# Patient Record
Sex: Male | Born: 1950 | Race: Black or African American | Hispanic: No | Smoking: Former smoker
Health system: Southern US, Community
[De-identification: ages and names within clinical notes are randomized; demographics above are authoritative.]

## PROBLEM LIST (undated history)

## (undated) DIAGNOSIS — M549 Dorsalgia, unspecified: Secondary | ICD-10-CM

## (undated) DIAGNOSIS — J42 Unspecified chronic bronchitis: Secondary | ICD-10-CM

## (undated) DIAGNOSIS — E46 Unspecified protein-calorie malnutrition: Secondary | ICD-10-CM

## (undated) DIAGNOSIS — C229 Malignant neoplasm of liver, not specified as primary or secondary: Secondary | ICD-10-CM

## (undated) DIAGNOSIS — B1910 Unspecified viral hepatitis B without hepatic coma: Secondary | ICD-10-CM

## (undated) DIAGNOSIS — B159 Hepatitis A without hepatic coma: Secondary | ICD-10-CM

## (undated) DIAGNOSIS — E079 Disorder of thyroid, unspecified: Secondary | ICD-10-CM

## (undated) DIAGNOSIS — F32A Depression, unspecified: Secondary | ICD-10-CM

## (undated) DIAGNOSIS — K7460 Unspecified cirrhosis of liver: Secondary | ICD-10-CM

## (undated) DIAGNOSIS — F329 Major depressive disorder, single episode, unspecified: Secondary | ICD-10-CM

## (undated) DIAGNOSIS — R6510 Systemic inflammatory response syndrome (SIRS) of non-infectious origin without acute organ dysfunction: Secondary | ICD-10-CM

## (undated) DIAGNOSIS — S2242XA Multiple fractures of ribs, left side, initial encounter for closed fracture: Secondary | ICD-10-CM

## (undated) DIAGNOSIS — I639 Cerebral infarction, unspecified: Secondary | ICD-10-CM

## (undated) DIAGNOSIS — R51 Headache: Secondary | ICD-10-CM

## (undated) DIAGNOSIS — R200 Anesthesia of skin: Secondary | ICD-10-CM

## (undated) DIAGNOSIS — B1920 Unspecified viral hepatitis C without hepatic coma: Secondary | ICD-10-CM

## (undated) DIAGNOSIS — G40909 Epilepsy, unspecified, not intractable, without status epilepticus: Secondary | ICD-10-CM

## (undated) DIAGNOSIS — G8929 Other chronic pain: Secondary | ICD-10-CM

## (undated) DIAGNOSIS — R55 Syncope and collapse: Principal | ICD-10-CM

## (undated) DIAGNOSIS — I252 Old myocardial infarction: Secondary | ICD-10-CM

## (undated) HISTORY — PX: CIRCUMCISION: SUR203

---

## 2000-01-14 ENCOUNTER — Encounter: Payer: Self-pay | Admitting: Emergency Medicine

## 2000-01-14 ENCOUNTER — Inpatient Hospital Stay (HOSPITAL_COMMUNITY): Admission: EM | Admit: 2000-01-14 | Discharge: 2000-01-22 | Payer: Self-pay | Admitting: Emergency Medicine

## 2000-01-29 ENCOUNTER — Inpatient Hospital Stay (HOSPITAL_COMMUNITY): Admission: EM | Admit: 2000-01-29 | Discharge: 2000-02-09 | Payer: Self-pay | Admitting: Emergency Medicine

## 2000-01-29 ENCOUNTER — Encounter: Payer: Self-pay | Admitting: Emergency Medicine

## 2000-04-19 ENCOUNTER — Encounter: Admission: RE | Admit: 2000-04-19 | Discharge: 2000-04-19 | Payer: Self-pay | Admitting: Internal Medicine

## 2000-06-17 ENCOUNTER — Encounter: Admission: RE | Admit: 2000-06-17 | Discharge: 2000-06-17 | Payer: Self-pay | Admitting: Hematology and Oncology

## 2006-12-09 ENCOUNTER — Emergency Department (HOSPITAL_COMMUNITY): Admission: EM | Admit: 2006-12-09 | Discharge: 2006-12-09 | Payer: Self-pay | Admitting: Emergency Medicine

## 2007-01-02 ENCOUNTER — Emergency Department (HOSPITAL_COMMUNITY): Admission: EM | Admit: 2007-01-02 | Discharge: 2007-01-02 | Payer: Self-pay | Admitting: Emergency Medicine

## 2009-09-14 DIAGNOSIS — I252 Old myocardial infarction: Secondary | ICD-10-CM

## 2009-09-14 DIAGNOSIS — I639 Cerebral infarction, unspecified: Secondary | ICD-10-CM

## 2009-09-14 HISTORY — PX: LIVER SURGERY: SHX698

## 2009-09-14 HISTORY — DX: Old myocardial infarction: I25.2

## 2009-09-14 HISTORY — DX: Cerebral infarction, unspecified: I63.9

## 2009-10-03 ENCOUNTER — Emergency Department (HOSPITAL_COMMUNITY): Admission: EM | Admit: 2009-10-03 | Discharge: 2009-10-03 | Payer: Self-pay | Admitting: Emergency Medicine

## 2009-10-04 ENCOUNTER — Emergency Department (HOSPITAL_COMMUNITY): Admission: EM | Admit: 2009-10-04 | Discharge: 2009-10-05 | Payer: Self-pay | Admitting: Emergency Medicine

## 2010-11-30 LAB — RAPID URINE DRUG SCREEN, HOSP PERFORMED
Amphetamines: NOT DETECTED
Barbiturates: NOT DETECTED
Benzodiazepines: NOT DETECTED
Cocaine: NOT DETECTED
Opiates: NOT DETECTED
Tetrahydrocannabinol: NOT DETECTED

## 2010-11-30 LAB — HEPATIC FUNCTION PANEL
ALT: 65 U/L — ABNORMAL HIGH (ref 0–53)
AST: 79 U/L — ABNORMAL HIGH (ref 0–37)
Alkaline Phosphatase: 94 U/L (ref 39–117)
Bilirubin, Direct: 0.3 mg/dL (ref 0.0–0.3)

## 2010-11-30 LAB — DIFFERENTIAL
Basophils Absolute: 0 10*3/uL (ref 0.0–0.1)
Lymphocytes Relative: 29 % (ref 12–46)
Lymphs Abs: 1.7 10*3/uL (ref 0.7–4.0)
Monocytes Absolute: 0.8 10*3/uL (ref 0.1–1.0)
Neutro Abs: 2.8 10*3/uL (ref 1.7–7.7)

## 2010-11-30 LAB — CBC
Hemoglobin: 12.6 g/dL — ABNORMAL LOW (ref 13.0–17.0)
RBC: 3.76 MIL/uL — ABNORMAL LOW (ref 4.22–5.81)
RDW: 14.2 % (ref 11.5–15.5)
WBC: 6.1 10*3/uL (ref 4.0–10.5)

## 2010-11-30 LAB — BASIC METABOLIC PANEL
Calcium: 8.8 mg/dL (ref 8.4–10.5)
GFR calc Af Amer: >60 mL/min (ref >60)
GFR calc non Af Amer: >60 mL/min (ref >60)
Glucose, Bld: 96 mg/dL (ref 70–99)
Potassium: 4 mEq/L (ref 3.5–5.1)
Sodium: 134 mEq/L — ABNORMAL LOW (ref 135–145)

## 2010-11-30 LAB — ETHANOL: Alcohol, Ethyl (B): 118 mg/dL — ABNORMAL HIGH (ref 0–10)

## 2010-11-30 LAB — TRICYCLICS SCREEN, URINE: TCA Scrn: NOT DETECTED

## 2010-12-01 LAB — DIFFERENTIAL
Basophils Relative: 0 % (ref 0–1)
Eosinophils Absolute: 0.8 10*3/uL — ABNORMAL HIGH (ref 0.0–0.7)
Monocytes Absolute: 0.8 10*3/uL (ref 0.1–1.0)
Monocytes Relative: 14 % — ABNORMAL HIGH (ref 3–12)

## 2010-12-01 LAB — RAPID URINE DRUG SCREEN, HOSP PERFORMED
Benzodiazepines: NOT DETECTED
Cocaine: NOT DETECTED
Tetrahydrocannabinol: NOT DETECTED

## 2010-12-01 LAB — CBC
Hemoglobin: 12.3 g/dL — ABNORMAL LOW (ref 13.0–17.0)
Platelets: 104 10*3/uL — ABNORMAL LOW (ref 150–400)
RDW: 14.3 % (ref 11.5–15.5)

## 2010-12-01 LAB — ETHANOL: Alcohol, Ethyl (B): <5 mg/dL (ref 0–10)

## 2010-12-01 LAB — COMPREHENSIVE METABOLIC PANEL
ALT: 60 U/L — ABNORMAL HIGH (ref 0–53)
AST: 78 U/L — ABNORMAL HIGH (ref 0–37)
Albumin: 2.8 g/dL — ABNORMAL LOW (ref 3.5–5.2)
Alkaline Phosphatase: 92 U/L (ref 39–117)
Potassium: 4.1 mEq/L (ref 3.5–5.1)
Sodium: 137 mEq/L (ref 135–145)
Total Protein: 7.6 g/dL (ref 6.0–8.3)

## 2011-01-30 NOTE — Discharge Summary (Signed)
[facility]  Patient:    Cameron Santos, Cameron Santos                           MRN: [id number] Adm. Date:  [id number] Disc. Date: 02/09/00 Attending:  Levy Sjogren Dictator:   Tawni Millers, M.D.                           Discharge Summary  DISCHARGE DIAGNOSES: 1. End-stage liver disease, secondary to alcohol, hepatitis-A, B, and C. 2. Mild pancreatitis. 3. Alcohol abuse. 4. Tobacco abuse.  DISCHARGE MEDICATIONS: 1. Ciprofloxacin 750 mg one p.o. q. week on Thursday. 2. Peridex oral mouth wash 15 cc p.o. b.i.d. swish and spit. 3. Vitamin K 10 mg one p.o. q.d. 4. Propranolol 40 mg one p.o. b.i.d. 5. K-Dur 20 mEq one p.o. q.d. 6. Lactulose syrup 30 cc b.i.d. 7. Lasix 40 mg one p.o. b.i.d. 8. Aldactone 50 mg p.o. b.i.d. 9. Multivitamins one p.o. q.d.  HISTORY OF PRESENT ILLNESS:  This is a 60 year old African-American male with end-stage liver disease with hepatitis-B, C, and A, and a long history of alcohol abuse, who was just discharged on Jan 21, 2000, with an exacerbation of liver failure.  He was discharged home with Hospice services, to stay with his estranged wife.  Due to a social situation, he was no longer able to stay there, and he was dropped off at the emergency room.  PHYSICAL EXAMINATION:  VITAL SIGNS:  Temperature 98.6 degrees, blood pressure 125/56, pulse 106, respirations 24.  GENERAL:  He is a pleasant black male, in no acute distress.  HEENT:  Pertinent for scleral icterus, and poor dentition.  LUNGS:  Clear to auscultation bilaterally.  HEART:  A regular rate and rhythm.  ABDOMEN:  Only slightly distended, soft, only mildly tender with positive bowel  sounds.  EXTREMITIES:  No cyanosis, clubbing, or edema.  NEUROLOGIC:  Shows nonfocal.  Positive asterixis.  A chest x-ray:  No acute disease.  LABORATORY DATA:  Sodium 136, potassium 4.6, chloride 10, CO2 of 25, BUN 16, creatinine 1.2, blood sugar 109.  Total protein  11.1, albumin 2.1, AST 97, ALT 0, alkaline phosphatase 110, total bilirubin 5.6.  (Bilirubin was less than 2 upon  discharge from the hospital last week.)  Also has a lipase of 162, amylase of 606. A urine drug screen is cocaine-positive.  Urinalysis otherwise shows urobilinogen. Cardiac enzymes negative.  PT 21.8, INR 2.7, PTT 39.  HOSPITAL COURSE: #1 - END-STAGE LIVER DISEASE:  Actually due to the increased icterus, the patient must have been drinking outside of the hospital.  He also denies cocaine use, but his urine drug screen is positive.  His liver will continue to worsen if he does not stop drinking, and it is unknown how much longer his liver will hold out, even without drinking.  The patient is certainly end-stage, and will be transferred [facility] for Hospice care.  #2 - MILD PANCREATITIS:  Again this is likely brought on by the patients use of  alcohol.  He was kept n.p.o. for a day or so, and he began to tolerate p.o., and the lipase came down.  Upon discharge he was tolerating a full diet with no abdominal symptoms.  #3 - SOCIAL SITUATION:  The patient certainly needs Hospice care and will benefit from the services provided by [facility].  DISCHARGE PLANNING:  The patient will  be sent to [facility] on Feb 09, 2000, or Hospice care.  At the time of discharge the patient still expresses a desire for a full code blue.  This should be readdressed periodically, as his condition changes. Also, as the patient is on diuretics and has such severe liver failure, his electrolytes should be checked at least weekly, and medicines adjusted as indicated.  FOLLOWUP:  He will be followed up in the outpatient clinic on February 17, 2000.  At  this time the patient should have a BMET and a CBC. DD:  02/08/00 TD:  02/08/00 Job: [id number]

## 2011-01-30 NOTE — Discharge Summary (Signed)
[place]  Patient:    Cameron Santos, Cameron Santos                           MRN: [id number] Adm. Date:  [id number] Disc. Date: [id number] Attending:  Levy Sjogren Dictator:   Tawni Millers                           Discharge Summary  DATE OF BIRTH: 1950-11-03 (may be [birth year]  DISCHARGE DIAGNOSES:  1. End-stage liver disease secondary to alcohol, hepatitis A, hepatitis B,     hepatitis C.     a. Coagulopathy with elevated PT/INR and PTT.     b. Ascites.     c. Mild hepatic encephalopathy with NH3 (ammonia) from 60 to 53 this        admission.  2. Anemia/thrombocytopenia, multifactorial.  3. History of tobacco abuse.  4. History of alcohol abuse.  5. Mild acute renal failure, resolved.  HISTORY OF PRESENT ILLNESS: This patient is a 60 year old black male with a history of heavy alcohol use in the past, presenting with several days of increased abdominal girth, abdominal pain, and turning yellow.  He denies any hematemesis or hemoptysis.  Black and bloody stool noted.  He also denies recent alcohol use and said he had been clean for 18 months (though his wife later stated that he had in fact been drinking more recently, and the patient admitted that he only drinks a beer or two now and then).  SOCIAL HISTORY: The patient lives with his uncle and takes care of himself. He has a history of very heavy alcohol use.  FAMILY HISTORY: He has not talked to family members in many years.  MEDICATIONS: None.  PHYSICAL EXAMINATION:  VITAL SIGNS: Temperature 96.6 degrees, pulse 95, respirations 16, blood pressure 126/90.  Saturations 98% on room air.  GENERAL: This is a thin black male with a mildly slurred speech, but he is alert and oriented x 3.  HEENT: He has marked icterus.  He has sluggish pupils.  Oropharynx is dry with feted odor to breath.  Poor dentition.  NECK: Supple.  LUNGS: Few crackles noted in the bases, otherwise good air  movement.  CARDIOVASCULAR: Regular rate and rhythm.  ABDOMEN: Markedly distended and protuberant and tense, with trace of visible veins.  He has positive bowel sounds and there is mild tenderness, especially in the upper quadrants.  There is no rebound and no guarding.  EXTREMITIES:  He has trace edema of his ankles.  He has 2+ pitting edema at his hips.  He also has sacral edema.  GU: He has penile swelling and edema, but no discharge.  RECTAL: Guaiac negative.  NEUROLOGIC: He is alert and oriented x 4.  Cranial nerves 2-12 intact.  He has positive asterixis.  LABORATORY DATA: Admission urinalysis showed rare bacteria, appearance both amber and cloudy, with moderate amount bilirubin.  BMP showed a sodium of 131, potassium 3.3, chloride 101, CO2 21, BUN 19, creatinine 1.7, glucose 79. Total bilirubin was 6.0, alkaline phosphatase 66, AST 100, ALT 47, total protein 9.0, albumin 1.5, calcium 8.5.  Lipase 45, amylase 269.  WBC 6.1, hemoglobin 9.3, platelets 57,000, MCV 105.3; neutrophils 3.9%, lymphocyte count 1.0%.  CT of the abdomen and pelvis shows positive ascites and cirrhosis, no visible masses, but cannot rule out carcinomatosis of the omentum.  Note the study was  without contrast.  HOSPITAL COURSE: #1 - END-STAGE LIVER DISEASE: During the hospitalization the patient was initiated on Aldactone and Lasix and propranolol.  He diuresed several kilograms of fluid this admission.  He initially was to have paracentesis, but he had a refractory coagulopathy with an INR over 3 despite repeated infusions with fresh frozen plasma and p.o. vitamin K.  Also this admission he was found to be hepatitis B surface antigen positive, hepatitis B surface antibody negative, and hepatitis B cor antibody IgM negative.  He was also found to be hepatitis A antibody positive and hepatitis C antibody positive.  Given the hepatitis, HIV was tested and was negative.  By the day of discharge  the patient had a PT of 22.8 and INR 2.9, with ammonia level of 54.  His hepatitis B dump agent was pending, and his bilirubin the day prior to discharge had gone down to 2.6.  #2 - MILD RENAL FAILURE: This was likely from the patients decreased p.o. intake.  By the day of discharge his BUN was 6 and creatinine 0.7.  #3 - DISPOSITION: As results trickled in on this patient and we discovered he is hepatitis A, B, and C positive, but hepatitis B surface antibody positive, the patient likely at least has active hepatitis B and may also have active hepatitis C.  In addition, with his heavy alcohol history his prognosis is very poor.  Code status was discussed and when the patients wife came on the scene the patient changed his mind about his code status.  However, the patient is Hospice appropriate given his poor prognosis, and they will accept him with a full code status.  Therefore the patient was discharged to an apartment that was arranged for him to stay in, and will follow up with Hospice.  He will also follow up with me in the outpatient clinic on Jan 29, 2000 at 2 p.m.  DISCHARGE CONDITION: By the time of discharge the patient was ambulating fully independently, and was feeling much better.  DISCHARGE MEDICATIONS:  1. Cipro 750 mg 1 p.o. every Thursday.  2. Spironolactone 50 mg 1 p.o. b.i.d.  3. Lasix 40 mg 1 p.o. b.i.d.  4. Multivitamin 1 q.d.  5. Lactulose syrup 30 cc p.o. b.i.d.  6. Potassium 20 mEq 1 p.o. q.d.  7. Propranolol 40 mg 1 p.o. b.i.d.  8. Vitamin K 10 mg 1 p.o. q.d.  SPECIAL INSTRUCTIONS: The patient was instructed to avoid all alcoholic beverages and will see me in the clinic. DD:  01/21/00 TD:  01/22/00 Job: [id number]

## 2012-09-19 ENCOUNTER — Emergency Department (HOSPITAL_COMMUNITY): Payer: Self-pay

## 2012-09-19 ENCOUNTER — Emergency Department (HOSPITAL_COMMUNITY)
Admission: EM | Admit: 2012-09-19 | Discharge: 2012-09-19 | Disposition: A | Discharge status: Home / Self Care | Payer: Self-pay | Attending: Emergency Medicine | Admitting: Emergency Medicine

## 2012-09-19 ENCOUNTER — Encounter (HOSPITAL_COMMUNITY): Payer: Self-pay | Admitting: Emergency Medicine

## 2012-09-19 DIAGNOSIS — R05 Cough: Secondary | ICD-10-CM | POA: Insufficient documentation

## 2012-09-19 DIAGNOSIS — T148XXA Other injury of unspecified body region, initial encounter: Secondary | ICD-10-CM

## 2012-09-19 DIAGNOSIS — Z8673 Personal history of transient ischemic attack (TIA), and cerebral infarction without residual deficits: Secondary | ICD-10-CM | POA: Insufficient documentation

## 2012-09-19 DIAGNOSIS — S79929A Unspecified injury of unspecified thigh, initial encounter: Secondary | ICD-10-CM | POA: Insufficient documentation

## 2012-09-19 DIAGNOSIS — R51 Headache: Secondary | ICD-10-CM | POA: Insufficient documentation

## 2012-09-19 DIAGNOSIS — Y929 Unspecified place or not applicable: Secondary | ICD-10-CM | POA: Insufficient documentation

## 2012-09-19 DIAGNOSIS — T07XXXA Unspecified multiple injuries, initial encounter: Secondary | ICD-10-CM | POA: Insufficient documentation

## 2012-09-19 DIAGNOSIS — W19XXXA Unspecified fall, initial encounter: Secondary | ICD-10-CM

## 2012-09-19 DIAGNOSIS — Z8619 Personal history of other infectious and parasitic diseases: Secondary | ICD-10-CM | POA: Insufficient documentation

## 2012-09-19 DIAGNOSIS — R059 Cough, unspecified: Secondary | ICD-10-CM | POA: Insufficient documentation

## 2012-09-19 DIAGNOSIS — I252 Old myocardial infarction: Secondary | ICD-10-CM | POA: Insufficient documentation

## 2012-09-19 DIAGNOSIS — W1809XA Striking against other object with subsequent fall, initial encounter: Secondary | ICD-10-CM | POA: Insufficient documentation

## 2012-09-19 DIAGNOSIS — Y939 Activity, unspecified: Secondary | ICD-10-CM | POA: Insufficient documentation

## 2012-09-19 DIAGNOSIS — R109 Unspecified abdominal pain: Secondary | ICD-10-CM | POA: Insufficient documentation

## 2012-09-19 DIAGNOSIS — S79919A Unspecified injury of unspecified hip, initial encounter: Secondary | ICD-10-CM | POA: Insufficient documentation

## 2012-09-19 LAB — BASIC METABOLIC PANEL
CO2: 24 mEq/L (ref 19–32)
Calcium: 9.5 mg/dL (ref 8.4–10.5)
Creatinine, Ser: 0.95 mg/dL (ref 0.50–1.35)
Glucose, Bld: 95 mg/dL (ref 70–99)
Sodium: 133 mEq/L — ABNORMAL LOW (ref 135–145)

## 2012-09-19 MED ORDER — IOHEXOL 300 MG/ML  SOLN
100.0000 mL | Freq: Once | INTRAMUSCULAR | Status: AC | PRN
  Administered 2012-09-19: 100 mL via INTRAVENOUS

## 2012-09-19 MED ORDER — HYDROCODONE-ACETAMINOPHEN 5-325 MG PO TABS: 1.0000 | ORAL_TABLET | ORAL | Status: DC | PRN

## 2012-09-19 MED ORDER — SODIUM CHLORIDE 0.9 % IV BOLUS (SEPSIS)
1000.0000 mL | Freq: Once | INTRAVENOUS | Status: AC
  Administered 2012-09-19: 1000 mL via INTRAVENOUS

## 2012-09-19 MED ORDER — IBUPROFEN 400 MG PO TABS: 400.0000 mg | ORAL_TABLET | Freq: Four times a day (QID) | ORAL | Status: DC | PRN

## 2012-09-19 MED ORDER — MORPHINE SULFATE 4 MG/ML IJ SOLN
4.0000 mg | Freq: Once | INTRAMUSCULAR | Status: AC
  Administered 2012-09-19: 4 mg via INTRAVENOUS
  Filled 2012-09-19: qty 1

## 2012-09-19 NOTE — ED Notes (Signed)
Back from CT

## 2012-09-19 NOTE — ED Provider Notes (Addendum)
History   This chart was scribed for Derwood Kaplan, MD by Gerlean Ren, ED Scribe. This patient was seen in room TR10C/TR10C and the patient's care was started at 4:37 PM    CSN: [id number]  Arrival date & time 09/19/12  1347   First MD Initiated Contact with Patient 09/19/12 1634      Chief Complaint  Patient presents with  . Fall     The history is provided by the patient. No language interpreter was used.   Cameron Santos is a 62 y.o. male with h/o hepatitis A, hepatitis B, hepatitis C, CVA, MI, who presents to the Emergency Department complaining of right lateral ribs, right hip, and right thigh pain after falling last night and hitting a dresser.  Rib pain worsened by coughing and deep breathing.  Pt is ambulatory with pain.  Pt denies head trauma and LOC.  Pt states he was drinking yesterday but denies being intoxicated at the time of the fall.  Pt denies tobacco use.  History reviewed. No pertinent past medical history.  History reviewed. No pertinent past surgical history.  History reviewed. No pertinent family history.  History  Substance Use Topics  . Smoking status: Never Smoker   . Smokeless tobacco: Not on file  . Alcohol Use: Yes     Comment: occ      Review of Systems  HENT: Positive for neck pain.   Respiratory: Positive for cough.   Cardiovascular: Negative for chest pain.  Gastrointestinal: Positive for abdominal pain.  Musculoskeletal: Negative for back pain.       Rib pain, hip pain  Neurological: Positive for headaches. Negative for weakness and numbness.  Hematological: Does not bruise/bleed easily.    Allergies  Penicillins  Home Medications  No current outpatient prescriptions on file.  BP 127/73  Pulse 102  Temp 98.1 F (36.7 C) (Oral)  Resp 16  SpO2 97%  Physical Exam  Nursing note and vitals reviewed. Constitutional: He is oriented to person, place, and time. He appears well-developed and well-nourished.  HENT:  Head: Normocephalic  and atraumatic.       No scalp hematoma, no abrasions, no bruises.  Eyes: Conjunctivae normal are normal.  Neck: Normal range of motion. No tracheal deviation present.  Cardiovascular: Normal rate, regular rhythm and normal heart sounds.   No murmur heard. Pulmonary/Chest: Effort normal and breath sounds normal. No respiratory distress. He has no wheezes.  Abdominal: Soft. Bowel sounds are normal. There is tenderness.       Soft on left side and non-tender, tenderness over RUQ to palpation  Musculoskeletal: He exhibits tenderness. He exhibits no edema.       Clavicle exam normal, bilateral shoulders non-tender, bilateral upper extremities non-tender to palpation, right posterior lateral side tender to palpation, right flank tenderness, pelvis stable, mild right hip tenderness to palpation, rest of bilateral lower extremity exam non-tender to palpation, paraspinal tenderness bilaterally in neck, no bony cervical spine tendneress.  Neurological: He is alert and oriented to person, place, and time.  Skin: Skin is warm and dry.  Psychiatric: He has a normal mood and affect. His behavior is normal.    ED Course  Procedures (including critical care time) DIAGNOSTIC STUDIES: Oxygen Saturation is 97% on room air, adequate by my interpretation.    COORDINATION OF CARE: 4:44 PM- Patient informed of clinical course, understands medical decision-making process, and agrees with plan.  Ordered right ribs unilateral w/ chest CT.  Dg Ribs Unilateral W/chest Right  09/19/2012  *RADIOLOGY REPORT*  Clinical Data: History of fall complaining of right lateral and anterior rib pain.  RIGHT RIBS AND CHEST - 3+ VIEW  Comparison: No priors.  Findings: Lung volumes are slightly low and there is a linear opacity at the right base, compatible with subsegmental atelectasis.  No acute consolidative air space disease.  No pleural effusions.  No pneumothorax.  No suspicious appearing pulmonary nodules or masses are  identified.  Heart size is normal. Mediastinal contours are unremarkable.  No evidence of pulmonary edema.  Dedicated views of the right ribs demonstrate no definite acute displaced right-sided rib fractures.  IMPRESSION: 1.  Low lung volumes with minimal right lower lobe subsegmental atelectasis. 2.  No acute displaced right-sided rib fractures.   Original Report Authenticated By: Trudie Reed, M.D.      No diagnosis found.    MDM  Medical screening examination/treatment/procedure(s) were performed by me as the supervising physician. Scribe service was utilized for documentation only.  Pt comes in with cc of fall. Pt has right sided tenderness - flank, ribs and abd. Also has some hip pain with ambulation - but able to ambulate. The abd tenderness is quite significant, so - we will get CT to r/o solid organ injury.   Derwood Kaplan, MD 09/19/12 1725  Pt's CT is negative for any solid organ injury, or bleeding. There is concerns for CA. Pt has had a cancer workup according to him at the [place]. We will print the results for him to take to the [place].  The patient was counseled on the dangers of alcohol and tobacco use, and was advised to quit.  Reviewed strategies to maximize success, including removing cigarettes and smoking materials from environment.   Derwood Kaplan, MD 09/19/12 [phone]

## 2012-09-19 NOTE — ED Notes (Signed)
The edp has seen the pt before myself.  Pt alert

## 2012-09-19 NOTE — ED Notes (Signed)
Iv started and  Pain med given. Pt waiting for a c-t abd

## 2012-09-19 NOTE — ED Notes (Signed)
EDP at bedside  

## 2012-09-19 NOTE — ED Notes (Signed)
Pt c/o right sided rib pain after falling and hitting ribs on dresser

## 2013-07-07 ENCOUNTER — Observation Stay (HOSPITAL_COMMUNITY)
Admission: EM | Admit: 2013-07-07 | Discharge: 2013-07-09 | DRG: 312 | Disposition: A | Discharge status: Home / Self Care | Payer: Non-veteran care | Attending: Family Medicine | Admitting: Family Medicine

## 2013-07-07 ENCOUNTER — Emergency Department (HOSPITAL_COMMUNITY): Payer: Self-pay

## 2013-07-07 ENCOUNTER — Encounter (HOSPITAL_COMMUNITY): Payer: Self-pay | Admitting: Emergency Medicine

## 2013-07-07 DIAGNOSIS — S2239XA Fracture of one rib, unspecified side, initial encounter for closed fracture: Secondary | ICD-10-CM

## 2013-07-07 DIAGNOSIS — D696 Thrombocytopenia, unspecified: Secondary | ICD-10-CM

## 2013-07-07 DIAGNOSIS — E440 Moderate protein-calorie malnutrition: Secondary | ICD-10-CM | POA: Insufficient documentation

## 2013-07-07 DIAGNOSIS — S2232XA Fracture of one rib, left side, initial encounter for closed fracture: Secondary | ICD-10-CM

## 2013-07-07 DIAGNOSIS — B1920 Unspecified viral hepatitis C without hepatic coma: Secondary | ICD-10-CM

## 2013-07-07 DIAGNOSIS — R55 Syncope and collapse: Secondary | ICD-10-CM

## 2013-07-07 DIAGNOSIS — R202 Paresthesia of skin: Secondary | ICD-10-CM

## 2013-07-07 HISTORY — DX: Old myocardial infarction: I25.2

## 2013-07-07 HISTORY — DX: Epilepsy, unspecified, not intractable, without status epilepticus: G40.909

## 2013-07-07 HISTORY — DX: Disorder of thyroid, unspecified: E07.9

## 2013-07-07 HISTORY — DX: Unspecified chronic bronchitis: J42

## 2013-07-07 HISTORY — DX: Syncope and collapse: R55

## 2013-07-07 HISTORY — DX: Hepatitis a without hepatic coma: B15.9

## 2013-07-07 HISTORY — DX: Malignant neoplasm of liver, not specified as primary or secondary: C22.9

## 2013-07-07 HISTORY — DX: Depression, unspecified: F32.A

## 2013-07-07 HISTORY — DX: Unspecified viral hepatitis B without hepatic coma: B19.10

## 2013-07-07 HISTORY — DX: Headache: R51

## 2013-07-07 HISTORY — DX: Anesthesia of skin: R20.0

## 2013-07-07 HISTORY — DX: Unspecified viral hepatitis C without hepatic coma: B19.20

## 2013-07-07 HISTORY — DX: Major depressive disorder, single episode, unspecified: F32.9

## 2013-07-07 HISTORY — DX: Multiple fractures of ribs, left side, initial encounter for closed fracture: S22.42XA

## 2013-07-07 HISTORY — DX: Unspecified cirrhosis of liver: K74.60

## 2013-07-07 LAB — POCT I-STAT TROPONIN I: Troponin i, poc: 0 ng/mL (ref 0.00–0.08)

## 2013-07-07 LAB — CBC WITH DIFFERENTIAL/PLATELET
Basophils Absolute: 0 10*3/uL (ref 0.0–0.1)
Basophils Relative: 1 % (ref 0–1)
Lymphocytes Relative: 19 % (ref 12–46)
MCH: 33.2 pg (ref 26.0–34.0)
MCV: 95.1 fL (ref 78.0–100.0)
Monocytes Relative: 10 % (ref 3–12)
Neutro Abs: 4.6 10*3/uL (ref 1.7–7.7)
Platelets: 120 10*3/uL — ABNORMAL LOW (ref 150–400)
RBC: 4.1 MIL/uL — ABNORMAL LOW (ref 4.22–5.81)

## 2013-07-07 LAB — COMPREHENSIVE METABOLIC PANEL
AST: 129 U/L — ABNORMAL HIGH (ref 0–37)
Albumin: 2.7 g/dL — ABNORMAL LOW (ref 3.5–5.2)
Alkaline Phosphatase: 120 U/L — ABNORMAL HIGH (ref 39–117)
BUN: 19 mg/dL (ref 6–23)
Creatinine, Ser: 1.05 mg/dL (ref 0.50–1.35)
GFR calc Af Amer: 85 mL/min — ABNORMAL LOW (ref >90)
Glucose, Bld: 78 mg/dL (ref 70–99)
Potassium: 4.2 mEq/L (ref 3.5–5.1)
Total Protein: 8.2 g/dL (ref 6.0–8.3)

## 2013-07-07 LAB — RAPID URINE DRUG SCREEN, HOSP PERFORMED
Amphetamines: NOT DETECTED
Barbiturates: NOT DETECTED
Benzodiazepines: NOT DETECTED
Cocaine: NOT DETECTED
Opiates: NOT DETECTED

## 2013-07-07 LAB — ETHANOL: Alcohol, Ethyl (B): <11 mg/dL (ref 0–11)

## 2013-07-07 MED ORDER — SENNOSIDES-DOCUSATE SODIUM 8.6-50 MG PO TABS
1.0000 | ORAL_TABLET | Freq: Every evening | ORAL | Status: DC | PRN
  Filled 2013-07-07: qty 1

## 2013-07-07 MED ORDER — HEPARIN SODIUM (PORCINE) 5000 UNIT/ML IJ SOLN
5000.0000 [IU] | Freq: Three times a day (TID) | INTRAMUSCULAR | Status: DC
Start: 2013-07-07 — End: 2013-07-09
  Administered 2013-07-07 – 2013-07-09 (×5): 5000 [IU] via SUBCUTANEOUS
  Filled 2013-07-07 (×8): qty 1

## 2013-07-07 MED ORDER — NALOXONE HCL 0.4 MG/ML IJ SOLN: 0.4000 mg | INTRAMUSCULAR | Status: DC | PRN

## 2013-07-07 MED ORDER — ASPIRIN EC 81 MG PO TBEC
81.0000 mg | DELAYED_RELEASE_TABLET | Freq: Every day | ORAL | Status: DC
  Administered 2013-07-07 – 2013-07-08 (×2): 81 mg via ORAL
  Filled 2013-07-07 (×3): qty 1

## 2013-07-07 MED ORDER — SODIUM CHLORIDE 0.9 % IV SOLN
INTRAVENOUS | Status: DC
  Administered 2013-07-07 – 2013-07-08 (×2): via INTRAVENOUS

## 2013-07-07 MED ORDER — MORPHINE SULFATE 4 MG/ML IJ SOLN
4.0000 mg | Freq: Once | INTRAMUSCULAR | Status: AC
  Administered 2013-07-07: 4 mg via INTRAVENOUS
  Filled 2013-07-07: qty 1

## 2013-07-07 MED ORDER — MORPHINE SULFATE 4 MG/ML IJ SOLN
4.0000 mg | INTRAMUSCULAR | Status: DC | PRN
  Administered 2013-07-07: 4 mg via INTRAVENOUS
  Administered 2013-07-08: 2 mg via INTRAVENOUS
  Filled 2013-07-07 (×2): qty 1

## 2013-07-07 MED ORDER — ONDANSETRON HCL 4 MG PO TABS: 4.0000 mg | ORAL_TABLET | Freq: Four times a day (QID) | ORAL | Status: DC | PRN

## 2013-07-07 MED ORDER — ONDANSETRON HCL 4 MG/2ML IJ SOLN
4.0000 mg | Freq: Once | INTRAMUSCULAR | Status: AC
  Administered 2013-07-07: 4 mg via INTRAVENOUS
  Filled 2013-07-07: qty 2

## 2013-07-07 MED ORDER — ONDANSETRON HCL 4 MG/2ML IJ SOLN: 4.0000 mg | Freq: Four times a day (QID) | INTRAMUSCULAR | Status: DC | PRN

## 2013-07-07 MED ORDER — INFLUENZA VAC SPLIT QUAD 0.5 ML IM SUSP
0.5000 mL | INTRAMUSCULAR | Status: AC
  Administered 2013-07-08: 0.5 mL via INTRAMUSCULAR
  Filled 2013-07-07: qty 0.5

## 2013-07-07 NOTE — ED Notes (Signed)
During urination patient appeared in pain. Pt. Stated his pain was a 7 and that it hurt to pee.

## 2013-07-07 NOTE — H&P (Signed)
Family Medicine Teaching [facility] Admission History and Physical Service Pager: [phone]  Patient name: [name] Medical record number: [id number] Date of birth: Sep 06, 1951 Age: 62 y.o. Gender: male  Primary Care Provider: [facility], MD Consultants: none  Code Status: Full   Chief Complaint: pain in left side   Assessment and Plan: Cameron Santos is a 62 y.o. male presenting with pain in his left from an episode of passing out. He was found to have broken ribs on x-ray.  PMH is significant for MI, CVA in 2011, Hepatitis A,B,C, Liver Cancer, and thyroid disease Epilepsy, disease.   # Syncope: Likely from orthostasis but may be vasovagal in nature due to the warm feeling that he expresses before he blacks out. Unlikely arrythmia or seizure. Decreased PO off an on over the last couple of months due to anorexia would most coincide with orthostasis due to poor intravascular volumes. Arrythmia is less likely normal EKG and denying any cardiac symptoms. His spells are not witnessed so seizure is a possibility but he denies loosing his bowels/bladder, is not post-ictal, and hasn't bit his tongue.  - monitor on telemetry  - Echo - PT/OT  - SW for possible possible alcohol or substance abuse  - risk stratification: HbA1c, lipid panel  - orthostatics - will replete his fluids  - BMET AM  - CIWA  # Broken ribs: CXR found multiple displaced rib fractures on his left side. Question how hard he hit in order to break his ribs. In pain upon movement and deep inspiration.  - morphine 4 mg PRN for pain - Ibuprofen 600mg  Q6.  # Thyroid disease: he reports a history of thyroid disease but does not currently take any medication. Cannot find any previous TSH markers. Will assess with labs.  - TSH   # Liver carcinoma: May be reason for recent anorexia/wt loss. Reports to being treated in [place], [place]. Received a letter last week for continue treatment but does not have a date set  for return. Treatment is currently in limbo as it sounds.  - Will attempt to get records  # Hepatitis C/Cirrhosis: Enzymes elevated above previous in 2011.  Bili slightly elevated.  Plts low to 120 but above previous. He reports that he was transmitted Hep C by using one of his [employer] friend's razors.  - Coags - Need records from [place] as above.   # Cardiovascular: H/O MI No complaints of CP, palpitations, and no h/o cardiac disease. Troponin on admission neg. BP at or near goal on admission - Tele as above - monitor BP - Start ASA 81 - lipid panel as above  # Thrombocytopenia: noted on admission. Above baseline from previous labs in 2011. Likely sedcondary to cirrhosis. OK for heparin - CBC in AM  # Neuro: h/o CVA in 2011 and epilepsy as a child. No neurological deficits on admission. Unlikely contributor to current syncopal workup - avoid seizure threshold lowering medications.  # FEN/GI: Protein calorie malnutrition - Nutrition consult - IVF of NS at 138ml/hr -  heart healthy diet   Prophylaxis: hep subQ  Disposition: admitted for   History of Present Illness: Cameron Santos is a 62 y.o. male presenting with pain in the left flank ab pain in his right shoulder. The pain in his left side was due to his fall. He has been passing out. He was getting ready to walk up some stairs and blacked out. No one witnessed this event.  He started  feeling prior to blacking out and was trying to make it into the house but fell on the porch in front of his house. He has had roughly three episodes in the past two weeks. These episodes started about a month ago.  Sitting down alleviates blacking out. He hasn't lost his bowels or urinated himself when he has one of these episodes. Has not been started on any new medications.   Pain in his right shoulder has been going on for about a month. A throbbing pain that radiates to his right elbow. He reports that dorsal aspect of his fingers have been going numb and  have been cramping up. He has been taking ibuprofen was helping. He was 200 mg every 4 hours but now it is not helping. The pain is constant regardless if he is moving it or lying on it. He doesn't have any "grip" in his right hand like he use.   He denies chest pain, shortness of breath, ab pain, but does report leg cramps. He says he drinks plenty of water.   Review Of Systems: Per HPI with the following additions: All other systems reviewed and otherwise normal.  Otherwise 12 point review of systems was performed and was unremarkable.  There are no active problems to display for this patient.  Past Medical History: Past Medical History  Diagnosis Date  . Thyroid disease   . CVA (cerebral infarction)     2011  . Syncope and collapse     "here lately" (07/07/2013)  . Cirrhosis of liver     "one step up from cirrhosis" (07/07/2013)  . MI, old 2011  . Lower extremity numbness     "feet and legs go to sleep randomly" (07/07/2013)  . Chronic bronchitis     "spring q yr; comes w/the pollen" (07/07/2013)  . Stroke 2011    "weak on my right arm & leg since" (07/07/2013)  . Hepatitis C virus     "A, B, & C" (07/07/2013)  . Hepatitis A   . Hepatitis B   . Headache(784.0)     "1-2 days q week; from stress" (07/07/2013"  . 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 Depression   . Liver cancer     "been diagnosed w/moderate liver cancer" (07/07/2013)  . Traumatic closed displaced fracture of multiple ribs of left side     /notes 07/07/2013; "fell ~ 1 wk ago" (07/07/2013)   Past Surgical History: Past Surgical History  Procedure Laterality Date  . Liver surgery  2011    "went in to kill 2 spots on my liver; killed one of them; never did kill the 2nd one" (07/07/2013)   Social History: History  Substance Use Topics  . Smoking status: Former Smoker -- 1.00 packs/day for 25 years    Types: Cigarettes  . Smokeless  tobacco: Never Used     Comment: 07/07/2013 "quit smoking cigarettes ~ 20 yr ago"  . 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   Please also refer to relevant sections of EMR.  Family History: History reviewed. No pertinent family history. Allergies and Medications: Allergies  Allergen Reactions  . Penicillins Diarrhea  and Rash    Has been 20 years since last known reaction   No current facility-administered medications on file prior to encounter.   Current Outpatient Prescriptions on File Prior to Encounter  Medication Sig Dispense Refill  . ibuprofen (ADVIL,MOTRIN) 200 MG tablet Take 200 mg by mouth every 6 (six) hours as needed. For muscle cramps/pain      . Multiple Vitamin (MULTIVITAMIN WITH MINERALS) TABS Take 1 tablet by mouth daily.        Objective: BP 132/86  Pulse 88  Temp(Src) 98 F (36.7 C) (Oral)  Resp 17  Ht 5\' 7"  (1.702 m)  Wt 130 lb (58.968 kg)  BMI 20.36 kg/m2  SpO2 100% Exam: General: NAD, thin african [place] male. In pain upon movement.  HEENT: EOMI, PERRL, MMM, oropharynx clear, poor dentition with loose teeth evident. Scleral icterus  Cardiovascular: S1S2, RRR, no murmus, gallops or rubs  Respiratory: CTAB, no wheezes or crackles, wincing upon inspiration.  Abdomen: soft, non tender, non distended, +BS  Extremities: warm and well perfused, +2 pulses in UE/LE  MSK: right shoulder has decreased active flexion to 130 degrees but passive flexion to 180 degrees, 90 degrees of abduction, normal grip strength in right and left, decreased sensation of right forearm compared to the left.  Skin: no bruises present on left flank or indention's  Neuro: CN 2-12 intact, alert and oriented,   Labs and Imaging: CBC BMET   Recent Labs Lab 07/07/13 1330  WBC 6.9   HGB 13.6  HCT 39.0  PLT 120*    Recent Labs Lab 07/07/13 1330  NA 136  K 4.2  CL 102  CO2 24  BUN 19  CREATININE 1.05  GLUCOSE 78  CALCIUM 9.0     EtOH: <11.   UDS: + THC   CT head  IMPRESSION:  Stable atrophy and microvascular ischemic change. No acute process  by noncontrast CT.  CXR IMPRESSION:  Multiple displaced left rib fractures. No pneumothorax or pleural  effusion.  Lumbar spine xray   IMPRESSION:  Moderate to severe degenerative disc disease at L4-5. Bilateral pars  defects at L5. No acute abnormality seen in the lumbar spine.  Right shoulder xray IMPRESSION:  Negative.  EKG: NSR   Clare Gandy, MD 07/07/2013, 5:20 PM PGY-1, [facility] Family Medicine FPTS Intern pager: [phone], text pages welcome   ----------------------------------------------------- UPPER LEVEL ADDENDUM  Pt seen and examined. Discussed case w/ Dr. Jordan Likes and have made edits/orders where necessary  Shelly Flatten, MD Family Medicine PGY-3 07/07/2013, 5:30 PM

## 2013-07-07 NOTE — ED Provider Notes (Signed)
CSN: [id number]     Arrival date & time 07/07/13  1125 History   First MD Initiated Contact with Patient 07/07/13 1132     No chief complaint on file.  (Consider location/radiation/quality/duration/timing/severity/associated sxs/prior Treatment) HPI Comments: Patient presents with left rib pain after a fall. He states that 2 days ago he had a near syncopal episode when he was walking up some stairs and fell over onto his left side hurting his left ribs. He denies any preceding events prior to the fall but says he felt like he was going black out and fell over onto his ribs. Since that time he had some blurry vision in his right eye. He denies hitting his head during the fall. He denies any injury or pain to the eye. He denies any headaches. He denies any neck or back pain. He complains of some pain in his left ribs and his right shoulder from the fall. He states he's had one other recent fall about 2 weeks ago that was similar to this episode. He has a history of alcohol abuse in the past but currently denies any ongoing alcohol use. He states occasionally has a beer. He has a history of what he says is liver cancer that was being treated for by the [facility] in [place]. He states he got a treatment about 2 years ago but didn't have followup treatment. He states he had a stroke with some right-sided weakness in 2011. He has ongoing numbness to his right arm and right leg but it's unclear whether this is changed significantly from his prior stroke. He denies any new weakness on his right side. He also complains of bad teeth and had a loose tooth involving the right upper jaw. He did not have a tendency currently sees.   Past Medical History  Diagnosis Date  . Hepatitis C virus   . Thyroid disease    History reviewed. No pertinent past surgical history. History reviewed. No pertinent family history. History  Substance Use Topics  . Smoking status: Never Smoker   . Smokeless tobacco: Not on file   . Alcohol Use: Yes     Comment: occ    Review of Systems  Constitutional: Negative for fever, chills, diaphoresis and fatigue.  HENT: Negative for congestion, rhinorrhea and sneezing.   Eyes: Negative.   Respiratory: Negative for cough, chest tightness and shortness of breath.   Cardiovascular: Positive for chest pain. Negative for leg swelling.  Gastrointestinal: Negative for nausea, vomiting, abdominal pain, diarrhea and blood in stool.  Genitourinary: Negative for frequency, hematuria, flank pain and difficulty urinating.  Musculoskeletal: Positive for arthralgias and back pain.  Skin: Negative for rash.  Neurological: Positive for syncope and numbness. Negative for dizziness, speech difficulty, weakness and headaches.    Allergies  Penicillins  Home Medications   Current Outpatient Rx  Name  Route  Sig  Dispense  Refill  . ibuprofen (ADVIL,MOTRIN) 200 MG tablet   Oral   Take 200 mg by mouth every 6 (six) hours as needed. For muscle cramps/pain         . Multiple Vitamin (MULTIVITAMIN WITH MINERALS) TABS   Oral   Take 1 tablet by mouth daily.          BP 153/91  Pulse 74  Temp(Src) 97.8 F (36.6 C) (Oral)  Resp 12  Ht 5\' 7"  (1.702 m)  Wt 130 lb (58.968 kg)  BMI 20.36 kg/m2  SpO2 100% Physical Exam  Constitutional: He is oriented to  person, place, and time. He appears well-developed and well-nourished.  HENT:  Head: Normocephalic and atraumatic.  Mouth/Throat: Oropharynx is clear and moist.  Eyes: Conjunctivae and EOM are normal. Pupils are equal, round, and reactive to light.  Patient seems to have a lower lateral visual field deficit to the right eye  Neck: Normal range of motion. Neck supple.  No pain along the cervical or thoracic spine. There some mild tenderness to the mid and lower lumbar spine.  Cardiovascular: Normal rate, regular rhythm and normal heart sounds.   Pulmonary/Chest: Effort normal and breath sounds normal. No respiratory distress. He  has no wheezes. He has no rales. He exhibits tenderness (positive ttenderness to the left mid anterior and lateral ribs. No crepitus or deformity. No signs of external trauma to the chest or abdomen).  Abdominal: Soft. Bowel sounds are normal. There is no tenderness. There is no rebound and no guarding.  Musculoskeletal: Normal range of motion. He exhibits no edema.  Lymphadenopathy:    He has no cervical adenopathy.  Neurological: He is alert and oriented to person, place, and time. He has normal strength. A sensory deficit is present. No cranial nerve deficit. GCS eye subscore is 4. GCS verbal subscore is 5. GCS motor subscore is 6.  Patient has some numbness to light touch in the right arm and right leg  Skin: Skin is warm and dry. No rash noted.  Psychiatric: He has a normal mood and affect.    ED Course  Procedures (including critical care time) Labs Review Results for orders placed during the hospital encounter of 07/07/13  CBC WITH DIFFERENTIAL      Result Value Range   WBC 6.9  4.0 - 10.5 K/uL   RBC 4.10 (*) 4.22 - 5.81 MIL/uL   Hemoglobin 13.6  13.0 - 17.0 g/dL   HCT 16.1  09.6 - 04.5 %   MCV 95.1  78.0 - 100.0 fL   MCH 33.2  26.0 - 34.0 pg   MCHC 34.9  30.0 - 36.0 g/dL   RDW 40.9  81.1 - 91.4 %   Platelets 120 (*) 150 - 400 K/uL   Neutrophils Relative % 67  43 - 77 %   Neutro Abs 4.6  1.7 - 7.7 K/uL   Lymphocytes Relative 19  12 - 46 %   Lymphs Abs 1.3  0.7 - 4.0 K/uL   Monocytes Relative 10  3 - 12 %   Monocytes Absolute 0.7  0.1 - 1.0 K/uL   Eosinophils Relative 4  0 - 5 %   Eosinophils Absolute 0.2  0.0 - 0.7 K/uL   Basophils Relative 1  0 - 1 %   Basophils Absolute 0.0  0.0 - 0.1 K/uL  COMPREHENSIVE METABOLIC PANEL      Result Value Range   Sodium 136  135 - 145 mEq/L   Potassium 4.2  3.5 - 5.1 mEq/L   Chloride 102  96 - 112 mEq/L   CO2 24  19 - 32 mEq/L   Glucose, Bld 78  70 - 99 mg/dL   BUN 19  6 - 23 mg/dL   Creatinine, Ser 7.82  0.50 - 1.35 mg/dL    Calcium 9.0  8.4 - 95.6 mg/dL   Total Protein 8.2  6.0 - 8.3 g/dL   Albumin 2.7 (*) 3.5 - 5.2 g/dL   AST 213 (*) 0 - 37 U/L   ALT 127 (*) 0 - 53 U/L   Alkaline Phosphatase 120 (*) 39 -  117 U/L   Total Bilirubin 1.4 (*) 0.3 - 1.2 mg/dL   GFR calc non Af Amer 74 (*) >90 mL/min   GFR calc Af Amer 85 (*) >90 mL/min  ETHANOL      Result Value Range   Alcohol, Ethyl (B) <11  0 - 11 mg/dL  URINE RAPID DRUG SCREEN (HOSP PERFORMED)      Result Value Range   Opiates NONE DETECTED  NONE DETECTED   Cocaine NONE DETECTED  NONE DETECTED   Benzodiazepines NONE DETECTED  NONE DETECTED   Amphetamines NONE DETECTED  NONE DETECTED   Tetrahydrocannabinol POSITIVE (*) NONE DETECTED   Barbiturates NONE DETECTED  NONE DETECTED  POCT I-STAT TROPONIN I      Result Value Range   Troponin i, poc 0.00  0.00 - 0.08 ng/mL   Comment 3            Dg Chest 2 View  07/07/2013   CLINICAL DATA:  Fall, soreness and chest and lower back  EXAM: CHEST  2 VIEW  COMPARISON:  09/19/2012  FINDINGS: The heart size and vascular pattern are normal. There is no infiltrate or effusion. There is no pneumothorax. There is a moderately displaced fracture of the posterior left 5th rib. There is a mildly displaced fracture of the posterior left 6th and 7th ribs. There is also a hairline nondisplaced fracture of the posterior lateral left 6th rib.  IMPRESSION: Multiple displaced left rib fractures. No pneumothorax or pleural effusion.   Electronically Signed   By: Esperanza Heir M.D.   On: 07/07/2013 14:32   Dg Lumbar Spine Complete  07/07/2013   CLINICAL DATA:  Lower back pain after fall.  EXAM: LUMBAR SPINE - COMPLETE 4+ VIEW  COMPARISON:  None.  FINDINGS: There is no evidence of acute lumbar spine fracture. Bilateral pars defects are seen at L5. Alignment is normal. Moderate to severe degenerative disc disease is noted at L4-5.  IMPRESSION: Moderate to severe degenerative disc disease at L4-5. Bilateral pars defects at L5. No acute  abnormality seen in the lumbar spine.   Electronically Signed   By: Roque Lias M.D.   On: 07/07/2013 14:34   Dg Shoulder Right  07/07/2013   CLINICAL DATA:  Right shoulder pain after fall, cannot perform axillary  EXAM: RIGHT SHOULDER - 2+ VIEW  COMPARISON:  None.  FINDINGS: There is no evidence of fracture or dislocation. There is no evidence of arthropathy or other focal bone abnormality. Soft tissues are unremarkable.  IMPRESSION: Negative.   Electronically Signed   By: Esperanza Heir M.D.   On: 07/07/2013 14:32   Ct Head Wo Contrast  07/07/2013   CLINICAL DATA:  Near syncopal episode, recent falls  EXAM: CT HEAD WITHOUT CONTRAST  TECHNIQUE: Contiguous axial images were obtained from the base of the skull through the vertex without intravenous contrast.  COMPARISON:  12/09/2006  FINDINGS: Stable diffuse brain atrophy with chronic white matter microvascular ischemic change. No acute intracranial hemorrhage, mass lesion, dense infarction, midline shift, herniation, hydrocephalus, or extra-axial fluid collection. Gray-white matter differentiation maintained. Cisterns are patent. Cerebellar atrophy as well. Orbits are unremarkable. Mastoids are clear. Minor left ethmoid mucosal thickening appearing chronic. Other sinuses clear. Atherosclerosis of the intracranial vessels at the skull base.  IMPRESSION: Stable atrophy and microvascular ischemic change. No acute process by noncontrast CT.   Electronically Signed   By: Ruel Favors M.D.   On: 07/07/2013 14:20     Imaging Review Dg Chest 2 View  07/07/2013   CLINICAL DATA:  Fall, soreness and chest and lower back  EXAM: CHEST  2 VIEW  COMPARISON:  09/19/2012  FINDINGS: The heart size and vascular pattern are normal. There is no infiltrate or effusion. There is no pneumothorax. There is a moderately displaced fracture of the posterior left 5th rib. There is a mildly displaced fracture of the posterior left 6th and 7th ribs. There is also a hairline  nondisplaced fracture of the posterior lateral left 6th rib.  IMPRESSION: Multiple displaced left rib fractures. No pneumothorax or pleural effusion.   Electronically Signed   By: Esperanza Heir M.D.   On: 07/07/2013 14:32   Dg Lumbar Spine Complete  07/07/2013   CLINICAL DATA:  Lower back pain after fall.  EXAM: LUMBAR SPINE - COMPLETE 4+ VIEW  COMPARISON:  None.  FINDINGS: There is no evidence of acute lumbar spine fracture. Bilateral pars defects are seen at L5. Alignment is normal. Moderate to severe degenerative disc disease is noted at L4-5.  IMPRESSION: Moderate to severe degenerative disc disease at L4-5. Bilateral pars defects at L5. No acute abnormality seen in the lumbar spine.   Electronically Signed   By: Roque Lias M.D.   On: 07/07/2013 14:34   Dg Shoulder Right  07/07/2013   CLINICAL DATA:  Right shoulder pain after fall, cannot perform axillary  EXAM: RIGHT SHOULDER - 2+ VIEW  COMPARISON:  None.  FINDINGS: There is no evidence of fracture or dislocation. There is no evidence of arthropathy or other focal bone abnormality. Soft tissues are unremarkable.  IMPRESSION: Negative.   Electronically Signed   By: Esperanza Heir M.D.   On: 07/07/2013 14:32   Ct Head Wo Contrast  07/07/2013   CLINICAL DATA:  Near syncopal episode, recent falls  EXAM: CT HEAD WITHOUT CONTRAST  TECHNIQUE: Contiguous axial images were obtained from the base of the skull through the vertex without intravenous contrast.  COMPARISON:  12/09/2006  FINDINGS: Stable diffuse brain atrophy with chronic white matter microvascular ischemic change. No acute intracranial hemorrhage, mass lesion, dense infarction, midline shift, herniation, hydrocephalus, or extra-axial fluid collection. Gray-white matter differentiation maintained. Cisterns are patent. Cerebellar atrophy as well. Orbits are unremarkable. Mastoids are clear. Minor left ethmoid mucosal thickening appearing chronic. Other sinuses clear. Atherosclerosis of the  intracranial vessels at the skull base.  IMPRESSION: Stable atrophy and microvascular ischemic change. No acute process by noncontrast CT.   Electronically Signed   By: Ruel Favors M.D.   On: 07/07/2013 14:20    EKG Interpretation     Ventricular Rate:  67 PR Interval:  132 QRS Duration: 84 QT Interval:  423 QTC Calculation: 446 R Axis:   47 Text Interpretation:  Sinus rhythm since last tracing no significant change            MDM   1. Paresthesias   2. Syncope   3. Rib fractures, left, closed, initial encounter    Patient presents after a near syncopal event. He has no suggestions of arrhythmia currently. He does have some increased numbness in his right arm and right leg with visual deficit on the right. I feel that he needs further evaluation for syncopal episode unlikely an MRI of his brain. He was admitted to the family medicine service who is on call for unassigned.    Rolan Bucco, MD 07/07/13 [phone]

## 2013-07-07 NOTE — ED Notes (Signed)
Pt presents with multiple falls x 1 week, pt reports due to "blacking out".  Pt reports blurred vision to R eye but denies hitting his head.  Pt reports falling onto edge of concrete porch, reports R sided rib pain.  Pt also reports R upper tooth that is loose, x 3 weeks.

## 2013-07-07 NOTE — ED Notes (Signed)
Pt also reports "blurry" vision to rt eye since the falls began.

## 2013-07-07 NOTE — ED Notes (Signed)
Pt c/o falling twice in the last week. Pt states he fell and hit a concrete porch and now has rt sided rib pain. Pt denies any difficulty breathing. Pt states he has been having black outs, has hx of stroke and MI in 2011 but denies any deficits or issues since then. Pt denies HA and denies hitting his head when he fell. Pt does report loose rt tooth.

## 2013-07-08 DIAGNOSIS — R209 Unspecified disturbances of skin sensation: Secondary | ICD-10-CM

## 2013-07-08 DIAGNOSIS — B1920 Unspecified viral hepatitis C without hepatic coma: Secondary | ICD-10-CM

## 2013-07-08 DIAGNOSIS — D696 Thrombocytopenia, unspecified: Secondary | ICD-10-CM

## 2013-07-08 DIAGNOSIS — R55 Syncope and collapse: Secondary | ICD-10-CM

## 2013-07-08 DIAGNOSIS — S2239XA Fracture of one rib, unspecified side, initial encounter for closed fracture: Secondary | ICD-10-CM

## 2013-07-08 DIAGNOSIS — S2249XA Multiple fractures of ribs, unspecified side, initial encounter for closed fracture: Secondary | ICD-10-CM

## 2013-07-08 DIAGNOSIS — E440 Moderate protein-calorie malnutrition: Secondary | ICD-10-CM | POA: Insufficient documentation

## 2013-07-08 LAB — CBC
HCT: 33.8 % — ABNORMAL LOW (ref 39.0–52.0)
Hemoglobin: 11.6 g/dL — ABNORMAL LOW (ref 13.0–17.0)
MCH: 32.8 pg (ref 26.0–34.0)
MCHC: 34.3 g/dL (ref 30.0–36.0)
MCV: 95.5 fL (ref 78.0–100.0)
Platelets: 102 10*3/uL — ABNORMAL LOW (ref 150–400)
RDW: 12.5 % (ref 11.5–15.5)

## 2013-07-08 LAB — BASIC METABOLIC PANEL
BUN: 16 mg/dL (ref 6–23)
Chloride: 104 mEq/L (ref 96–112)
GFR calc Af Amer: >90 mL/min (ref >90)
GFR calc non Af Amer: 87 mL/min — ABNORMAL LOW (ref >90)
Potassium: 4.1 mEq/L (ref 3.5–5.1)
Sodium: 136 mEq/L (ref 135–145)

## 2013-07-08 LAB — LIPID PANEL
Cholesterol: 117 mg/dL (ref 0–200)
HDL: 46 mg/dL (ref >39)
LDL Cholesterol: 58 mg/dL (ref 0–99)
Total CHOL/HDL Ratio: 2.5 RATIO
Triglycerides: 66 mg/dL (ref <150)
VLDL: 13 mg/dL (ref 0–40)

## 2013-07-08 LAB — HEMOGLOBIN A1C: Hgb A1c MFr Bld: 4.8 % (ref <5.7)

## 2013-07-08 LAB — TSH: TSH: 1.291 u[IU]/mL (ref 0.350–4.500)

## 2013-07-08 MED ORDER — OXYCODONE-ACETAMINOPHEN 5-325 MG PO TABS
1.0000 | ORAL_TABLET | Freq: Four times a day (QID) | ORAL | Status: DC | PRN
  Administered 2013-07-08 – 2013-07-09 (×3): 1 via ORAL
  Filled 2013-07-08 (×3): qty 1

## 2013-07-08 MED ORDER — ADULT MULTIVITAMIN W/MINERALS CH
1.0000 | ORAL_TABLET | Freq: Every day | ORAL | Status: DC
  Administered 2013-07-08: 1 via ORAL
  Filled 2013-07-08 (×2): qty 1

## 2013-07-08 MED ORDER — ENSURE COMPLETE PO LIQD
237.0000 mL | Freq: Three times a day (TID) | ORAL | Status: DC
  Administered 2013-07-08: 237 mL via ORAL

## 2013-07-08 NOTE — Discharge Summary (Signed)
Family Medicine Teaching [facility] Discharge Summary  Patient name: Cameron Santos Medical record number: [id number] Date of birth: Jul 21, 1951 Age: 62 y.o. Gender: male Date of Admission: 07/07/2013  Date of Discharge: 07/09/2013 Admitting Physician: Tobey Grim, MD  Primary Care Provider: [facility], MD Consultants: Cardiology, Dr. Jacinto Halim  Indication for Hospitalization: Syncope  Discharge Diagnoses/Problem List:  Principal Problem:   Syncope Active Problems:   Malnutrition of moderate degree   Hepatitis C   Rib fractures   Thrombocytopenia  Disposition: Home  Discharge Condition: Stable.  Brief Hospital Course:  Cameron Santos is a 62 y.o. male who presented with pain on his left side after having fallen secondary to several syncopal episodes.  He was found to have broken ribs on x-ray. PMH is significant for MI, CVA in 2011, Hepatitis C/Cirrhosis, thyroid disease.   # Syncope - Patient orthostatic on admission.  - Syncope likely from orthostasis/volume depletion. Seizure, Arrythmia unlikely given normal EKG and no tongue biting, loss of bladder/bowel, no post-ictal phase.  - Patient was monitored on Tele with no events. - Echo and EKG were unremarkable - see labs and imaging below.  - He was treated with IV fluids during admission with resolution of orthostasis. - Given reports of several episodes of syncope (even while sitting), cardiology was consulted.  Cardiology recommended outpatient follow up and holter monitor.  - Patient to follow up with cardiology on discharge.  # Broken ribs: CXR found multiple displaced rib fractures on his left side.  - Pain was controlled with Morphine initially. - Patient was then transitioned to PO Percocet  # Hx of Thyroid disease - TSH was obtained and was WNL (1.291)  # Hepatitis C/Cirrhosis: AST/ALT - 129/127, elevated from prior in 2012 (78/60); INR 1.28 with slightly elevated PT at 15.7  - No evidence of fluid  overload during admission - Attempted to obtain records from the [place] - Patient will need continued outpatient follow up for this  # Thrombocytopenia: Chronic  - Likely secondary to underlying Liver disease  - Platelet count was monitored via CBC and was stable during admission  # Protein Calorie malnutrition  - Nutrition was consulted during admission.  Ensure was added TID in addition to a multivitamin.   Issues for Follow Up:  1) BP to be reassessed; Patient will likely need to be started on antihypertensive 2) Patient to follow up with cardiology for holter  Significant Labs and Imaging:   Recent Labs Lab 07/07/13 1330 07/08/13 1000  WBC 6.9 5.2  HGB 13.6 11.6*  HCT 39.0 33.8*  PLT 120* 102*    Recent Labs Lab 07/07/13 1330 07/08/13 0435  NA 136 136  K 4.2 4.1  CL 102 104  CO2 24 23  GLUCOSE 78 77  BUN 19 16  CREATININE 1.05 0.95  CALCIUM 9.0 8.4  ALKPHOS 120*  --   AST 129*  --   ALT 127*  --   ALBUMIN 2.7*  --    HIV antibody - Neg  Results/Tests Pending at Time of Discharge:  None  Discharge Medications:    Medication List         aspirin 81 MG EC tablet  Take 1 tablet (81 mg total) by mouth daily.     feeding supplement (ENSURE COMPLETE) Liqd  Take 237 mLs by mouth 3 (three) times daily between meals.     ibuprofen 200 MG tablet  Commonly known as:  ADVIL,MOTRIN  Take 200 mg by mouth  every 6 (six) hours as needed. For muscle cramps/pain     multivitamin with minerals Tabs tablet  Take 1 tablet by mouth daily.     oxyCODONE-acetaminophen 5-325 MG per tablet  Commonly known as:  PERCOCET/ROXICET  Take 1-2 tablets by mouth every 6 (six) hours as needed.     senna-docusate 8.6-50 MG per tablet  Commonly known as:  Senokot-S  Take 1 tablet by mouth at bedtime as needed.        Discharge Instructions: Please refer to Patient Instructions section of EMR for full details.  Patient was counseled important signs and symptoms that should prompt  return to medical care, changes in medications, dietary instructions, activity restrictions, and follow up appointments.   Follow-Up Appointments: Follow-up Information   Schedule an appointment as soon as possible for a visit with [facility], MD.   Specialty:  General Practice   Contact information:   [phone]  [place] [address] [phone]       Call Pamella Pert, MD. (Call on monday to schedule an appointment)    Specialty:  Cardiology   Contact information:   [address] [place] [address] [phone]       Tommie Sams, DO 07/09/2013, 9:32 AM PGY-2, [facility]

## 2013-07-08 NOTE — Progress Notes (Signed)
FMTS Attending Daily Note:  Renold Don MD  [phone] pager  Family Practice pager:  [phone] I have seen and examined this patient and have reviewed their chart. I have discussed this patient with the resident. I agree with the resident's findings, assessment and care plan.  Additionally:  See separate admit note for details.   Tobey Grim, MD 07/08/2013 12:22 PM

## 2013-07-08 NOTE — Progress Notes (Signed)
Family Medicine Teaching Service Daily Progress Note Intern Pager: [phone]  Patient name: Cameron Santos Medical record number: [id number] Date of birth: [birth year] Age: 62 y.o. Gender: male  Primary Care Provider: [facility], MD Consultants: None Code Status: Full Code  Pt Overview and Major Events to Date:  10/24 - Admitted  Assessment and Plan: RAN TULLIS is a 62 y.o. male presenting with pain in his left from an episode of passing out. He was found to have broken ribs on x-ray. PMH is significant for MI, CVA in 2011, Hepatitis A,B,C, Liver Cancer, thyroid disease, and Epilepsy.   # Syncope: Likely from orthostasis/volume depletion.  Seizure, Arrythmia unlikely given normal EKG and no tongue biting, loss of bladder/bowel, no post-ictal phase. - Continue to monitor on tele - Awaiting Echo, PT/OT, SW - Lipid panel obtained for risk stratification (unremarkable- see below). Awaiting A1C - Repeating orthostatics today - CIWA given ETOH abuse  # Broken ribs: CXR found multiple displaced rib fractures on his left side.  - Morphine D/C'd - Percocet 5-325 Q6H PRN  # Thyroid disease: he reports a history of thyroid disease but does not currently take any medication.  - Awaiting TSH  # Liver carcinoma: May be reason for recent anorexia/wt loss. Reports to being treated in [place], [place].  - Will attempt to get records   # Hepatitis C/Cirrhosis: AST/ALT - 129/127, elevated from prior in 2012 (78/60); INR 1.28 with slightly elevated PT at 15.7 - No evidence of fluid overload at this time. - Will attempt to obtain records from [place]  # Cardiovascular: H/O MI. Troponin Negative. EKG - NSR with LAE.  - ASA 81 mg daily - BP mildly elevated. Patient may need to be on antihypertensives as outpatient.  # Thrombocytopenia: Chronic - Likely secondary to underlying Liver disease - CBC today - Will continue to monitor closely  # Neuro: h/o CVA in 2011 and epilepsy as a child -  Neurologically intact - Will continue to monitor  # Protein Calorie malnutrition - Albumin 2.7, with recent weight loss - Consulting nutrition  # FEN/GI:  - Nutrition consult  - heart healthy diet  Prophylaxis: hep subQ  Disposition: Pending continued work up  Subjective:  Patient reports that he is feeling okay. He does have significant pain from coughing/rib fractures. No overnight events.  Objective: Temp:  [97.8 F (36.6 C)-98.3 F (36.8 C)] 98.3 F (36.8 C) (10/25 0614) Pulse Rate:  [62-93] 62 (10/25 0614) Resp:  [11-24] 18 (10/24 1743) BP: (118-159)/(75-94) 148/81 mmHg (10/25 0614) SpO2:  [96 %-100 %] 100 % (10/25 0614) Weight:  [130 lb (58.968 kg)] 130 lb (58.968 kg) (10/24 1134)  Physical Exam: General: thin, african american gentleman in NAD.  Significant pain with motion. Cardiovascular: RRR. No m/r/g. Respiratory: CTAB. No rales, rhonchi, or wheezing. Abdomen: flat, soft, nontender, nondistended.  Extremities: No LE edema.  Skin: patient has multiple dry, skin lesions on his abdomen and lower extremities.  Neuro: CN 2-12 grossly intact, Muscle strength 5/5 in all extremities.   Laboratory:  Recent Labs Lab 07/07/13 1330  WBC 6.9  HGB 13.6  HCT 39.0  PLT 120*    Recent Labs Lab 07/07/13 1330 07/08/13 0435  NA 136 136  K 4.2 4.1  CL 102 104  CO2 24 23  BUN 19 16  CREATININE 1.05 0.95  CALCIUM 9.0 8.4  PROT 8.2  --   BILITOT 1.4*  --   ALKPHOS 120*  --   ALT  127*  --   AST 129*  --   GLUCOSE 78 77   Lipid Panel     Component Value Date/Time   CHOL 117 07/08/2013 0435   TRIG 66 07/08/2013 0435   HDL 46 07/08/2013 0435   CHOLHDL 2.5 07/08/2013 0435   VLDL 13 07/08/2013 0435   LDLCALC 58 07/08/2013 0435    Imaging/Diagnostic Tests:  Dg Chest 2 View 07/07/2013   CLINICAL DATA:  Fall, soreness and chest and lower back  EXAM: CHEST  2 VIEW  COMPARISON:  09/19/2012  FINDINGS: The heart size and vascular pattern are normal. There is no  infiltrate or effusion. There is no pneumothorax. There is a moderately displaced fracture of the posterior left 5th rib. There is a mildly displaced fracture of the posterior left 6th and 7th ribs. There is also a hairline nondisplaced fracture of the posterior lateral left 6th rib.  IMPRESSION: Multiple displaced left rib fractures. No pneumothorax or pleural effusion.    Dg Lumbar Spine Complete 07/07/2013  IMPRESSION: Moderate to severe degenerative disc disease at L4-5. Bilateral pars defects at L5. No acute abnormality seen in the lumbar spine.  Dg Shoulder Right 07/07/2013   IMPRESSION: Negative.  Ct Head Wo Contrast 07/07/2013   IMPRESSION: Stable atrophy and microvascular ischemic change. No acute process by noncontrast CT.  Tommie Sams, DO 07/08/2013, 8:42 AM PGY-2, [facility] FPTS Intern pager: [phone], text pages welcome

## 2013-07-08 NOTE — H&P (Signed)
FMTS Attending Admission Note: Cameron Don MD Personal pager:  [phone] FPTS Service Pager:  [phone]  I  have seen and examined this patient, reviewed their chart. I have discussed this patient with the resident. I agree with the resident's findings, assessment and care plan.  Additionally:  62 yo M with 2 week history of increasing number of syncopal .  No records, but evidently history of liver CA, MI, CVA, Hepatitis B and C, and unclear thyroid disease who presents with broken ribs after another fall.  Admitted for syncope workup.  Exam: General: Thin male lying in bed no acute distress HEENT: Normocephalic atraumatic. Abuse murmurs moist Neck no thyromegaly noted Heart: Regular rhythm Lungs: Clear Abdomen thin and nontender Extremities: Thin Skin: Papular rash scattered across abdomen and bilateral lower extremities. Some areas of plaques noted. Neuro: No gross focal deficits noted  Impression/plan: #1. Syncope: -Unclear etiology -He does seem to have some palpitations surrounding episodes of syncope. Episodes occurred while sitting and waiting for the bus and also while bending over to pick things up. Variable surrounding events -Echo is pending. - TSH is pending as he does have history of thyroid disease. -Disposition will be related to further syncopal workup  #2. Weight loss: -Thin male who is experiencing weight loss the past 6 months. -I do not see HIV test will obtain that today. -History of questionable liver cancer. We are attempting to obtain records from the [place] for this. -Liver function tests elevated today. Known history of hepatitis B and C.  #3. Of rib fractures: Non-pain control as per resident note   Tobey Grim, MD 07/08/2013 11:53 AM
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
## 2013-07-08 NOTE — Evaluation (Signed)
Physical Therapy Evaluation Patient Details Name: Cameron Santos MRN: [id number] DOB: 1951/03/18 Today's Date: 07/08/2013 Time: 4098-1191 PT Time Calculation (min): 18 min  PT Assessment / Plan / Recommendation History of Present Illness  Cameron Santos is a 62 y.o. male presenting with pain in his left side from an episode of passing out. He was found to have broken ribs on x-ray.  PMH is significant for MI, CVA in 2011, Hepatitis A,B,C, Liver Cancer, and thyroid disease Epilepsy, disease.   Clinical Impression  Patient presents with problems listed below.  Will benefit from acute PT to maximize independence prior to discharge.    PT Assessment  Patient needs continued PT services    Follow Up Recommendations  No PT follow up;Supervision - Intermittent    Does the patient have the potential to tolerate intense rehabilitation      Barriers to Discharge        Equipment Recommendations  None recommended by PT    Recommendations for Other Services     Frequency Min 3X/week    Precautions / Restrictions Precautions Precautions: None Precaution Comments: Patient reports no dizziness or lightheadedness during mobility/gait. Restrictions Weight Bearing Restrictions: No   Pertinent Vitals/Pain Pain limiting mobility      Mobility  Bed Mobility Bed Mobility: Supine to Sit;Sit to Supine Supine to Sit: 6: Modified independent (Device/Increase time);With rails;HOB flat Sit to Supine: 6: Modified independent (Device/Increase time) (increased time) Details for Bed Mobility Assistance: Verbal cues for technique.  Patient required increased time due to pain in ribs. Transfers Transfers: Sit to Stand;Stand to Sit Sit to Stand: 4: Min guard;With upper extremity assist;From bed Stand to Sit: 4: Min guard;To bed Details for Transfer Assistance: Assist for safety. Ambulation/Gait Ambulation/Gait Assistance: 4: Min guard Ambulation Distance (Feet): 200 Feet Assistive device:  None Ambulation/Gait Assistance Details: Verbal cues to move at slow safe pace.  Balance good.  Gait is slow and guarded. Gait Pattern: Step-through pattern;Decreased stride length;Antalgic;Decreased trunk rotation Gait velocity: Slow    Exercises     PT Diagnosis: Difficulty walking;Abnormality of gait;Acute pain  PT Problem List: Decreased strength;Decreased activity tolerance;Decreased mobility;Pain PT Treatment Interventions: Gait training;Stair training;Functional mobility training;Patient/family education     PT Goals(Current goals can be found in the care plan section) Acute Rehab PT Goals Patient Stated Goal: To decrease pain PT Goal Formulation: With patient Time For Goal Achievement: 07/15/13 Potential to Achieve Goals: Good  Visit Information  Last PT Received On: 07/08/13 Assistance Needed: +1 History of Present Illness: Cameron Santos is a 62 y.o. male presenting with pain in his left side from an episode of passing out. He was found to have broken ribs on x-ray.  PMH is significant for MI, CVA in 2011, Hepatitis A,B,C, Liver Cancer, and thyroid disease Epilepsy, disease.        Prior Functioning  Home Living Family/patient expects to be discharged to:: Private residence Living Arrangements: Spouse/significant other Available Help at Discharge: Family;Available 24 hours/day Type of Home: House Home Access: Stairs to enter [employer] of Steps: 3 Entrance Stairs-Rails: None Home Layout: One level Home Equipment: None Prior Function Level of Independence: Independent Communication Communication: No difficulties    Cognition  Cognition Arousal/Alertness: Awake/alert Behavior During Therapy: WFL for tasks assessed/performed Overall Cognitive Status: Within Functional Limits for tasks assessed    Extremity/Trunk Assessment Upper Extremity Assessment Upper Extremity Assessment: Overall WFL for tasks assessed Lower Extremity Assessment Lower Extremity  Assessment: Overall WFL for tasks assessed (Difficult moving during  testing.  Moves well to pull up sock)   Balance    End of Session PT - End of Session Equipment Utilized During Treatment: Gait belt Activity Tolerance: Patient limited by pain Patient left: in bed;with call bell/phone within reach Nurse Communication: Mobility status  GP     Vena Austria 07/08/2013, 12:38 PM Durenda Hurt. Renaldo Fiddler, [facility] Acute Rehab Services Pager [phone]

## 2013-07-08 NOTE — Progress Notes (Signed)
Pt orthostatics in doc flow sheet, pt denies dizziness.

## 2013-07-08 NOTE — Progress Notes (Signed)
  Echocardiogram 2D Echocardiogram has been performed.  Aribella Vavra 07/08/2013, 11:06 AM

## 2013-07-09 DIAGNOSIS — E440 Moderate protein-calorie malnutrition: Secondary | ICD-10-CM

## 2013-07-09 DIAGNOSIS — B1920 Unspecified viral hepatitis C without hepatic coma: Secondary | ICD-10-CM

## 2013-07-09 DIAGNOSIS — D696 Thrombocytopenia, unspecified: Secondary | ICD-10-CM

## 2013-07-09 MED ORDER — ASPIRIN 81 MG PO TBEC: 81.0000 mg | DELAYED_RELEASE_TABLET | Freq: Every day | ORAL | Status: DC

## 2013-07-09 MED ORDER — SENNOSIDES-DOCUSATE SODIUM 8.6-50 MG PO TABS: 1.0000 | ORAL_TABLET | Freq: Every evening | ORAL | Status: DC | PRN

## 2013-07-09 MED ORDER — ENSURE COMPLETE PO LIQD: 237.0000 mL | Freq: Three times a day (TID) | ORAL | Status: DC

## 2013-07-09 MED ORDER — OXYCODONE-ACETAMINOPHEN 5-325 MG PO TABS: 1.0000 | ORAL_TABLET | Freq: Four times a day (QID) | ORAL | Status: DC | PRN

## 2013-07-09 NOTE — Progress Notes (Signed)
Family Medicine Teaching Service Daily Progress Note Intern Pager: [phone]  Patient name: Cameron Santos Medical record number: [id number] Date of birth: 09-04-1951 Age: 62 y.o. Gender: male  Primary Care Provider: [facility], MD Consultants: None Code Status: Full Code  Pt Overview and Major Events to Date:  10/24 - Admitted  Assessment and Plan: Cameron Santos is a 62 y.o. male presenting with pain in his left from an episode of passing out. He was found to have broken ribs on x-ray. PMH is significant for MI, CVA in 2011, Hepatitis A,B,C, Liver Cancer, thyroid disease, and Epilepsy.   # Syncope: Likely from orthostasis/volume depletion.  Seizure, Arrythmia unlikely given normal EKG and no tongue biting, loss of bladder/bowel, no post-ictal phase. - Echo - Normal - Lipid panel unremarkable - Patient has had no events on tele - Orthostasis has resolved (orthostatic repeated yesterday). - Cardiology (Dr. Jacinto Halim) consulted given several episodes of syncope with some occuring while sitting.  Patient is to see him as an outpatient for follow up and Holter.  # Broken ribs: CXR found multiple displaced rib fractures on his left side.  - Percocet 5-325 Q6H PRN  # Thyroid disease: he reports a history of thyroid disease but does not currently take any medication.  - TSH - WNL.  # Liver carcinoma: May be reason for recent anorexia/wt loss. Reports to being treated in [place], [place].  - Have attempted to get records without success  # Hepatitis C/Cirrhosis: AST/ALT - 129/127, elevated from prior in 2012 (78/60); INR 1.28 with slightly elevated PT at 15.7 - No evidence of fluid overload at this time.  # Cardiovascular: H/O MI. Troponin Negative. EKG - NSR with LAE.  - ASA 81 mg daily - BP mildly elevated. Patient may need to be on antihypertensives as outpatient.  # Thrombocytopenia: Chronic - Likely secondary to underlying Liver disease - Repeat CBC today  # Neuro: h/o CVA in  2011 and epilepsy as a child - Neurologically intact - Will continue to monitor  # Protein Calorie malnutrition - Albumin 2.7, with recent weight loss - Nutrition recommending TID Ensure and Multivitamin  # FEN/GI:  - heart healthy diet  Prophylaxis: hep subQ  Disposition: Plan to discharge home today.   Subjective:  Patient continues to have rib pain, but controlled with Percocet. He also notes a episode of abdominal cramping last night. No overnight events.  No tele events.   Objective: Temp:  [98.3 F (36.8 C)-98.4 F (36.9 C)] 98.3 F (36.8 C) (10/26 0515) Pulse Rate:  [64-89] 89 (10/26 0515) Resp:  [16] 16 (10/26 0515) BP: (127-145)/(72-93) 127/79 mmHg (10/26 0515) SpO2:  [98 %-100 %] 100 % (10/26 0515)  Physical Exam: General: thin, african american gentleman in NAD. Cardiovascular: RRR. No m/r/g. Respiratory: CTAB. No rales, rhonchi, or wheezing. Abdomen: flat, soft, nontender, nondistended.  Extremities: No LE edema.   Laboratory:  Recent Labs Lab 07/07/13 1330 07/08/13 1000  WBC 6.9 5.2  HGB 13.6 11.6*  HCT 39.0 33.8*  PLT 120* 102*    Recent Labs Lab 07/07/13 1330 07/08/13 0435  NA 136 136  K 4.2 4.1  CL 102 104  CO2 24 23  BUN 19 16  CREATININE 1.05 0.95  CALCIUM 9.0 8.4  PROT 8.2  --   BILITOT 1.4*  --   ALKPHOS 120*  --   ALT 127*  --   AST 129*  --   GLUCOSE 78 77   Lipid Panel  Component Value Date/Time   CHOL 117 07/08/2013 0435   TRIG 66 07/08/2013 0435   HDL 46 07/08/2013 0435   CHOLHDL 2.5 07/08/2013 0435   VLDL 13 07/08/2013 0435   LDLCALC 58 07/08/2013 0435   Lab Results  Component Value Date   TSH 1.291 07/08/2013    Imaging/Diagnostic Tests:  Dg Chest 2 View 07/07/2013   CLINICAL DATA:  Fall, soreness and chest and lower back  EXAM: CHEST  2 VIEW  COMPARISON:  09/19/2012  FINDINGS: The heart size and vascular pattern are normal. There is no infiltrate or effusion. There is no pneumothorax. There is a  moderately displaced fracture of the posterior left 5th rib. There is a mildly displaced fracture of the posterior left 6th and 7th ribs. There is also a hairline nondisplaced fracture of the posterior lateral left 6th rib.  IMPRESSION: Multiple displaced left rib fractures. No pneumothorax or pleural effusion.    Dg Lumbar Spine Complete 07/07/2013  IMPRESSION: Moderate to severe degenerative disc disease at L4-5. Bilateral pars defects at L5. No acute abnormality seen in the lumbar spine.  Dg Shoulder Right 07/07/2013   IMPRESSION: Negative.  Ct Head Wo Contrast 07/07/2013   IMPRESSION: Stable atrophy and microvascular ischemic change. No acute process by noncontrast CT.  Cameron Sams, DO 07/09/2013, 8:03 AM PGY-2, [facility] FPTS Intern pager: [phone], text pages welcome

## 2013-07-09 NOTE — Discharge Summary (Signed)
Family Medicine Teaching Service  Discharge Note : Attending Jeff Rayven Hendrickson MD Pager [phone] Inpatient Team Pager:  [phone]  I have reviewed this patient and the patient's chart and have discussed discharge planning with the resident at the time of discharge. I agree with the discharge plan as above.    

## 2013-07-09 NOTE — Progress Notes (Signed)
FMTS Attending Daily Note:  Renold Don MD  [phone] pager  Family Practice pager:  [phone] I have seen and examined this patient and have reviewed their chart. I have discussed this patient with the resident. I agree with the resident's findings, assessment and care plan.  Additionally:  We have discussed case with Dr. Shon Baton over the phone.   Plan is for DC home today.  Will need Holter monitor on outpatient basis. No further sx's while in house. Continue further treatment/wu of liver issues back at [place].    Tobey Grim, MD 07/09/2013 10:31 AM

## 2014-04-28 ENCOUNTER — Encounter (HOSPITAL_COMMUNITY): Payer: Self-pay | Admitting: Emergency Medicine

## 2014-04-28 ENCOUNTER — Emergency Department (HOSPITAL_COMMUNITY)
Admission: EM | Admit: 2014-04-28 | Discharge: 2014-04-28 | Disposition: A | Discharge status: Home / Self Care | Payer: Non-veteran care | Attending: Emergency Medicine | Admitting: Emergency Medicine

## 2014-04-28 ENCOUNTER — Emergency Department (HOSPITAL_COMMUNITY): Payer: Non-veteran care

## 2014-04-28 DIAGNOSIS — Z87891 Personal history of nicotine dependence: Secondary | ICD-10-CM | POA: Insufficient documentation

## 2014-04-28 DIAGNOSIS — Z8619 Personal history of other infectious and parasitic diseases: Secondary | ICD-10-CM | POA: Insufficient documentation

## 2014-04-28 DIAGNOSIS — Z8673 Personal history of transient ischemic attack (TIA), and cerebral infarction without residual deficits: Secondary | ICD-10-CM | POA: Insufficient documentation

## 2014-04-28 DIAGNOSIS — Z880 Allergy status to penicillin: Secondary | ICD-10-CM | POA: Insufficient documentation

## 2014-04-28 DIAGNOSIS — S0990XA Unspecified injury of head, initial encounter: Secondary | ICD-10-CM | POA: Insufficient documentation

## 2014-04-28 DIAGNOSIS — S0101XA Laceration without foreign body of scalp, initial encounter: Secondary | ICD-10-CM

## 2014-04-28 DIAGNOSIS — Z8669 Personal history of other diseases of the nervous system and sense organs: Secondary | ICD-10-CM | POA: Insufficient documentation

## 2014-04-28 DIAGNOSIS — Z79899 Other long term (current) drug therapy: Secondary | ICD-10-CM | POA: Insufficient documentation

## 2014-04-28 DIAGNOSIS — Z8719 Personal history of other diseases of the digestive system: Secondary | ICD-10-CM | POA: Insufficient documentation

## 2014-04-28 DIAGNOSIS — Z7982 Long term (current) use of aspirin: Secondary | ICD-10-CM | POA: Insufficient documentation

## 2014-04-28 DIAGNOSIS — I252 Old myocardial infarction: Secondary | ICD-10-CM | POA: Insufficient documentation

## 2014-04-28 DIAGNOSIS — Z23 Encounter for immunization: Secondary | ICD-10-CM | POA: Insufficient documentation

## 2014-04-28 DIAGNOSIS — Z862 Personal history of diseases of the blood and blood-forming organs and certain disorders involving the immune mechanism: Secondary | ICD-10-CM | POA: Insufficient documentation

## 2014-04-28 DIAGNOSIS — Z8639 Personal history of other endocrine, nutritional and metabolic disease: Secondary | ICD-10-CM | POA: Insufficient documentation

## 2014-04-28 DIAGNOSIS — S0100XA Unspecified open wound of scalp, initial encounter: Secondary | ICD-10-CM | POA: Insufficient documentation

## 2014-04-28 DIAGNOSIS — Z8659 Personal history of other mental and behavioral disorders: Secondary | ICD-10-CM | POA: Insufficient documentation

## 2014-04-28 DIAGNOSIS — Z8505 Personal history of malignant neoplasm of liver: Secondary | ICD-10-CM | POA: Insufficient documentation

## 2014-04-28 DIAGNOSIS — Z8709 Personal history of other diseases of the respiratory system: Secondary | ICD-10-CM | POA: Insufficient documentation

## 2014-04-28 MED ORDER — TETANUS-DIPHTH-ACELL PERTUSSIS 5-2.5-18.5 LF-MCG/0.5 IM SUSP
0.5000 mL | Freq: Once | INTRAMUSCULAR | Status: AC
  Administered 2014-04-28: 0.5 mL via INTRAMUSCULAR
  Filled 2014-04-28: qty 0.5

## 2014-04-28 MED ORDER — HYDROGEN PEROXIDE 3 % EX SOLN
Freq: Once | CUTANEOUS | Status: AC
  Administered 2014-04-28: 1 via TOPICAL

## 2014-04-28 MED ORDER — HYDROGEN PEROXIDE 3 % EX SOLN
CUTANEOUS | Status: AC
  Administered 2014-04-28: 1 via TOPICAL
  Filled 2014-04-28: qty 473
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
## 2014-04-28 NOTE — ED Notes (Signed)
Patient is alert and oriented x3.  He was given DC instructions and follow up visit instructions.  Patient gave verbal understanding.  He was DC ambulatory under his own power to home.  V/S stable.  He was not showing any signs of distress on DC 

## 2014-04-29 NOTE — ED Provider Notes (Signed)
Medical screening examination/treatment/procedure(s) were performed by non-physician practitioner and as supervising physician I was immediately available for consultation/collaboration.   EKG Interpretation None       Varney Biles, MD 04/29/14 [phone]

## 2014-04-29 NOTE — ED Provider Notes (Signed)
Medical screening examination/treatment/procedure(s) were performed by non-physician practitioner and as supervising physician I was immediately available for consultation/collaboration.   EKG Interpretation None       Varney Biles, MD 04/29/14 [phone]

## 2014-05-11 ENCOUNTER — Emergency Department (HOSPITAL_COMMUNITY)
Admission: EM | Admit: 2014-05-11 | Discharge: 2014-05-11 | Disposition: A | Discharge status: Home / Self Care | Payer: Non-veteran care | Attending: Emergency Medicine | Admitting: Emergency Medicine

## 2014-05-11 ENCOUNTER — Encounter (HOSPITAL_COMMUNITY): Payer: Self-pay | Admitting: Emergency Medicine

## 2014-05-11 DIAGNOSIS — S0100XA Unspecified open wound of scalp, initial encounter: Secondary | ICD-10-CM | POA: Insufficient documentation

## 2014-05-11 DIAGNOSIS — Z8673 Personal history of transient ischemic attack (TIA), and cerebral infarction without residual deficits: Secondary | ICD-10-CM | POA: Insufficient documentation

## 2014-05-11 DIAGNOSIS — Z8659 Personal history of other mental and behavioral disorders: Secondary | ICD-10-CM | POA: Diagnosis not present

## 2014-05-11 DIAGNOSIS — L0201 Cutaneous abscess of face: Secondary | ICD-10-CM | POA: Diagnosis not present

## 2014-05-11 DIAGNOSIS — Z8709 Personal history of other diseases of the respiratory system: Secondary | ICD-10-CM | POA: Diagnosis not present

## 2014-05-11 DIAGNOSIS — S0590XA Unspecified injury of unspecified eye and orbit, initial encounter: Secondary | ICD-10-CM | POA: Insufficient documentation

## 2014-05-11 DIAGNOSIS — Z880 Allergy status to penicillin: Secondary | ICD-10-CM | POA: Insufficient documentation

## 2014-05-11 DIAGNOSIS — Y838 Other surgical procedures as the cause of abnormal reaction of the patient, or of later complication, without mention of misadventure at the time of the procedure: Secondary | ICD-10-CM | POA: Diagnosis not present

## 2014-05-11 DIAGNOSIS — Z4802 Encounter for removal of sutures: Secondary | ICD-10-CM | POA: Diagnosis not present

## 2014-05-11 DIAGNOSIS — Z8719 Personal history of other diseases of the digestive system: Secondary | ICD-10-CM | POA: Diagnosis not present

## 2014-05-11 DIAGNOSIS — Z79899 Other long term (current) drug therapy: Secondary | ICD-10-CM | POA: Insufficient documentation

## 2014-05-11 DIAGNOSIS — Z87891 Personal history of nicotine dependence: Secondary | ICD-10-CM | POA: Diagnosis not present

## 2014-05-11 DIAGNOSIS — I252 Old myocardial infarction: Secondary | ICD-10-CM | POA: Diagnosis not present

## 2014-05-11 DIAGNOSIS — Z8505 Personal history of malignant neoplasm of liver: Secondary | ICD-10-CM | POA: Diagnosis not present

## 2014-05-11 DIAGNOSIS — T798XXA Other early complications of trauma, initial encounter: Secondary | ICD-10-CM

## 2014-05-11 DIAGNOSIS — Z8669 Personal history of other diseases of the nervous system and sense organs: Secondary | ICD-10-CM | POA: Diagnosis not present

## 2014-05-11 DIAGNOSIS — Z8781 Personal history of (healed) traumatic fracture: Secondary | ICD-10-CM | POA: Insufficient documentation

## 2014-05-11 DIAGNOSIS — L03211 Cellulitis of face: Secondary | ICD-10-CM | POA: Diagnosis not present

## 2014-05-11 DIAGNOSIS — Z862 Personal history of diseases of the blood and blood-forming organs and certain disorders involving the immune mechanism: Secondary | ICD-10-CM | POA: Diagnosis not present

## 2014-05-11 DIAGNOSIS — Z8619 Personal history of other infectious and parasitic diseases: Secondary | ICD-10-CM | POA: Insufficient documentation

## 2014-05-11 DIAGNOSIS — Z8639 Personal history of other endocrine, nutritional and metabolic disease: Secondary | ICD-10-CM | POA: Insufficient documentation

## 2014-05-11 MED ORDER — SULFAMETHOXAZOLE-TMP DS 800-160 MG PO TABS
2.0000 | ORAL_TABLET | Freq: Once | ORAL | Status: AC
  Administered 2014-05-11: 2 via ORAL
  Filled 2014-05-11: qty 2

## 2014-05-11 MED ORDER — SULFAMETHOXAZOLE-TRIMETHOPRIM 800-160 MG PO TABS: 2.0000 | ORAL_TABLET | Freq: Two times a day (BID) | ORAL | Status: DC

## 2014-05-11 MED ORDER — MUPIROCIN 2 % EX OINT: 1.0000 "application " | TOPICAL_OINTMENT | Freq: Two times a day (BID) | CUTANEOUS | Status: DC

## 2014-05-11 NOTE — ED Notes (Signed)
Suture/staple removal from head.
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
## 2014-05-11 NOTE — Discharge Instructions (Signed)
Abscess Care After An abscess (also called a boil or furuncle) is an infected area that contains a collection of pus. Signs and symptoms of an abscess include pain, tenderness, redness, or hardness, or you may feel a moveable soft area under your skin. An abscess can occur anywhere in the body. The infection may spread to surrounding tissues causing cellulitis. A cut (incision) by the surgeon was made over your abscess and the pus was drained out. Gauze may have been packed into the space to provide a drain that will allow the cavity to heal from the inside outwards. The boil may be painful for 5 to 7 days. Most people with a boil do not have high fevers. Your abscess, if seen early, may not have localized, and may not have been lanced. If not, another appointment may be required for this if it does not get better on its own or with medications. HOME CARE INSTRUCTIONS   Only take over-the-counter or prescription medicines for pain, discomfort, or fever as directed by your caregiver.  When you bathe, soak and then remove gauze or iodoform packs at least daily or as directed by your caregiver. You may then wash the wound gently with mild soapy water. Repack with gauze or do as your caregiver directs. SEEK IMMEDIATE MEDICAL CARE IF:   You develop increased pain, swelling, redness, drainage, or bleeding in the wound site.  You develop signs of generalized infection including muscle aches, chills, fever, or a general ill feeling.  An oral temperature above 102 F (38.9 C) develops, not controlled by medication. See your caregiver for a recheck if you develop any of the symptoms described above. If medications (antibiotics) were prescribed, take them as directed. Document Released: 03/19/2005 Document Revised: 11/23/2011 Document Reviewed: 11/14/2007 [facility] Patient Information 2015 [place], [place]. This information is not intended to replace advice given to you by your health care provider. Make sure  you discuss any questions you have with your health care provider.  Facial Infection You have an infection of your face. This requires special attention to help prevent serious problems. Infections in facial wounds can cause poor healing and scars. They can also spread to deeper tissues, especially around the eye. Wound and dental infections can lead to sinusitis, infection of the eye socket, and even meningitis. Permanent damage to the skin, eye, and nervous system may result if facial infections are not treated properly. With severe infections, hospital care for IV antibiotic injections may be needed if they don't respond to oral antibiotics. Antibiotics must be taken for the full course to insure the infection is eliminated. If the infection came from a bad tooth, it may have to be extracted when the infection is under control. Warm compresses may be applied to reduce skin irritation and remove drainage. You might need a tetanus shot now if:  You cannot remember when your last tetanus shot was.  You have never had a tetanus shot.  The object that caused your wound was dirty. If you need a tetanus shot, and you decide not to get one, there is a rare chance of getting tetanus. Sickness from tetanus can be serious. If you got a tetanus shot, your arm may swell, get red and warm to the touch at the shot site. This is common and not a problem. SEEK IMMEDIATE MEDICAL CARE IF:   You have increased swelling, redness, or trouble breathing.  You have a severe headache, dizziness, nausea, or vomiting.  You develop problems with your eyesight.  You have a fever. Document Released: 10/08/2004 Document Revised: 11/23/2011 Document Reviewed: 08/31/2005 [facility] Patient Information 2015 [place], [place]. This information is not intended to replace advice given to you by your health care provider. Make sure you discuss any questions you have with your health care provider.  Suture Removal, Care  After Refer to this sheet in the next few weeks. These instructions provide you with information on caring for yourself after your procedure. Your health care provider may also give you more specific instructions. Your treatment has been planned according to current medical practices, but problems sometimes occur. Call your health care provider if you have any problems or questions after your procedure. WHAT TO EXPECT AFTER THE PROCEDURE After your stitches (sutures) are removed, it is typical to have the following:  Some discomfort and swelling in the wound area.  Slight redness in the area. HOME CARE INSTRUCTIONS   If you have skin adhesive strips over the wound area, do not take the strips off. They will fall off on their own in a few days. If the strips remain in place after 14 days, you may remove them.  Change any bandages (dressings) at least once a day or as directed by your health care provider. If the bandage sticks, soak it off with warm, soapy water.  Apply cream or ointment only as directed by your health care provider. If using cream or ointment, wash the area with soap and water 2 times a day to remove all the cream or ointment. Rinse off the soap and pat the area dry with a clean towel.  Keep the wound area dry and clean. If the bandage becomes wet or dirty, or if it develops a bad smell, change it as soon as possible.  Continue to protect the wound from injury.  Use sunscreen when out in the sun. New scars become sunburned easily. SEEK MEDICAL CARE IF:  You have increasing redness, swelling, or pain in the wound.  You see pus coming from the wound.  You have a fever.  You notice a bad smell coming from the wound or dressing.  Your wound breaks open (edges not staying together). Document Released: 05/26/2001 Document Revised: 06/21/2013 Document Reviewed: 04/12/2013 [facility] Patient Information 2015 [place], [place]. This information is not intended to replace advice  given to you by your health care provider. Make sure you discuss any questions you have with your health care provider.

## 2014-05-11 NOTE — ED Notes (Addendum)
PA in to I&D left forehead wound. Pt very intolerant to the slightest touch to forehead.

## 2014-05-11 NOTE — ED Notes (Signed)
Patient complains of yellow drainage from stapled area.

## 2014-05-13 ENCOUNTER — Encounter (HOSPITAL_COMMUNITY): Payer: Self-pay | Admitting: Emergency Medicine

## 2014-05-13 ENCOUNTER — Emergency Department (HOSPITAL_COMMUNITY)
Admission: EM | Admit: 2014-05-13 | Discharge: 2014-05-13 | Disposition: A | Discharge status: Home / Self Care | Payer: Non-veteran care | Attending: Emergency Medicine | Admitting: Emergency Medicine

## 2014-05-13 DIAGNOSIS — Z5189 Encounter for other specified aftercare: Secondary | ICD-10-CM

## 2014-05-13 DIAGNOSIS — Z87891 Personal history of nicotine dependence: Secondary | ICD-10-CM | POA: Diagnosis not present

## 2014-05-13 DIAGNOSIS — Z8673 Personal history of transient ischemic attack (TIA), and cerebral infarction without residual deficits: Secondary | ICD-10-CM | POA: Insufficient documentation

## 2014-05-13 DIAGNOSIS — Z8669 Personal history of other diseases of the nervous system and sense organs: Secondary | ICD-10-CM | POA: Diagnosis not present

## 2014-05-13 DIAGNOSIS — Z880 Allergy status to penicillin: Secondary | ICD-10-CM | POA: Insufficient documentation

## 2014-05-13 DIAGNOSIS — I252 Old myocardial infarction: Secondary | ICD-10-CM | POA: Diagnosis not present

## 2014-05-13 DIAGNOSIS — Z4801 Encounter for change or removal of surgical wound dressing: Secondary | ICD-10-CM | POA: Insufficient documentation

## 2014-05-13 DIAGNOSIS — Z79899 Other long term (current) drug therapy: Secondary | ICD-10-CM | POA: Insufficient documentation

## 2014-05-13 DIAGNOSIS — Z862 Personal history of diseases of the blood and blood-forming organs and certain disorders involving the immune mechanism: Secondary | ICD-10-CM | POA: Diagnosis not present

## 2014-05-13 DIAGNOSIS — Z8781 Personal history of (healed) traumatic fracture: Secondary | ICD-10-CM | POA: Diagnosis not present

## 2014-05-13 DIAGNOSIS — Z8505 Personal history of malignant neoplasm of liver: Secondary | ICD-10-CM | POA: Diagnosis not present

## 2014-05-13 DIAGNOSIS — Z8619 Personal history of other infectious and parasitic diseases: Secondary | ICD-10-CM | POA: Insufficient documentation

## 2014-05-13 DIAGNOSIS — Z8659 Personal history of other mental and behavioral disorders: Secondary | ICD-10-CM | POA: Insufficient documentation

## 2014-05-13 DIAGNOSIS — Z8639 Personal history of other endocrine, nutritional and metabolic disease: Secondary | ICD-10-CM | POA: Insufficient documentation

## 2014-05-13 NOTE — Discharge Instructions (Signed)
Take the antibiotic that was prescribed to you at your last visit. Follow up with your primary care doctor.  Wound Check Your wound appears healthy today. Your wound will heal gradually over time. Eventually a scar will form that will fade with time. FACTORS THAT AFFECT SCAR FORMATION:  People differ in the severity in which they scar.  Scar severity varies according to location, size, and the traits you inherited from your parents (genetic predisposition).  Irritation to the wound from infection, rubbing, or chemical exposure will increase the amount of scar formation. HOME CARE INSTRUCTIONS   If you were given a dressing, you should change it at least once a day or as instructed by your caregiver. If the bandage sticks, soak it off with a solution of hydrogen peroxide.  If the bandage becomes wet, dirty, or develops a bad smell, change it as soon as possible.  Look for signs of infection.  Only take over-the-counter or prescription medicines for pain, discomfort, or fever as directed by your caregiver. SEEK IMMEDIATE MEDICAL CARE IF:   You have redness, swelling, or increasing pain in the wound.  You notice pus coming from the wound.  You have a fever.  You notice a bad smell coming from the wound or dressing. Document Released: 06/06/2004 Document Revised: 11/23/2011 Document Reviewed: 08/31/2005 [facility] Patient Information 2015 [place], [place]. This information is not intended to replace advice given to you by your health care provider. Make sure you discuss any questions you have with your health care provider.
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
## 2014-05-13 NOTE — ED Notes (Signed)
Pt had staples removed from wound on left forehead on Friday. States that he was given a tetanus shot and some antibiotics but has not had them filled yet. Pt wants evaluation of wound.

## 2014-05-14 NOTE — ED Provider Notes (Signed)
Medical screening examination/treatment/procedure(s) were performed by non-physician practitioner and as supervising physician I was immediately available for consultation/collaboration.   EKG Interpretation None        Fredia Sorrow, MD 05/14/14 1128

## 2014-05-15 LAB — CULTURE, ROUTINE-ABSCESS

## 2014-05-15 NOTE — ED Provider Notes (Signed)
Medical screening examination/treatment/procedure(s) were performed by resident physician or non-physician practitioner and as supervising physician I was immediately available for consultation/collaboration.   Pauline Good MD.   Billy Fischer, MD 05/15/14 1228

## 2014-05-16 ENCOUNTER — Telehealth (HOSPITAL_BASED_OUTPATIENT_CLINIC_OR_DEPARTMENT_OTHER): Payer: Self-pay | Admitting: Emergency Medicine

## 2014-05-16 NOTE — Telephone Encounter (Signed)
Post ED Visit - Positive Culture Follow-up  Culture report reviewed by antimicrobial stewardship pharmacist: []  Wes [place], Pharm.D., BCPS []  Heide Guile, Pharm.D., BCPS []  Alycia Rossetti, Pharm.D., BCPS []  [place], Pharm.D., BCPS, AAHIVP []  Legrand Como, Pharm.D., BCPS, AAHIVP [x]  Carly Sabat, Pharm.D. []  Elenor Quinones, Pharm.D.  Positive Abscess culture Treated with Septra DS, organism sensitive to the same and no further patient follow-up is required at this time.  Ernesta Amble 05/16/2014, 12:51 PM

## 2014-09-11 ENCOUNTER — Encounter (HOSPITAL_COMMUNITY): Payer: Self-pay | Admitting: Emergency Medicine

## 2014-09-11 ENCOUNTER — Emergency Department (HOSPITAL_COMMUNITY)
Admission: EM | Admit: 2014-09-11 | Discharge: 2014-09-11 | Disposition: A | Discharge status: Home / Self Care | Payer: Non-veteran care | Attending: Emergency Medicine | Admitting: Emergency Medicine

## 2014-09-11 DIAGNOSIS — Z8659 Personal history of other mental and behavioral disorders: Secondary | ICD-10-CM | POA: Insufficient documentation

## 2014-09-11 DIAGNOSIS — Z8709 Personal history of other diseases of the respiratory system: Secondary | ICD-10-CM | POA: Insufficient documentation

## 2014-09-11 DIAGNOSIS — Z8781 Personal history of (healed) traumatic fracture: Secondary | ICD-10-CM | POA: Insufficient documentation

## 2014-09-11 DIAGNOSIS — Z792 Long term (current) use of antibiotics: Secondary | ICD-10-CM | POA: Insufficient documentation

## 2014-09-11 DIAGNOSIS — Z87891 Personal history of nicotine dependence: Secondary | ICD-10-CM | POA: Insufficient documentation

## 2014-09-11 DIAGNOSIS — Z8619 Personal history of other infectious and parasitic diseases: Secondary | ICD-10-CM | POA: Insufficient documentation

## 2014-09-11 DIAGNOSIS — Z8719 Personal history of other diseases of the digestive system: Secondary | ICD-10-CM | POA: Insufficient documentation

## 2014-09-11 DIAGNOSIS — M25552 Pain in left hip: Secondary | ICD-10-CM | POA: Insufficient documentation

## 2014-09-11 DIAGNOSIS — Z8673 Personal history of transient ischemic attack (TIA), and cerebral infarction without residual deficits: Secondary | ICD-10-CM | POA: Insufficient documentation

## 2014-09-11 DIAGNOSIS — Z880 Allergy status to penicillin: Secondary | ICD-10-CM | POA: Insufficient documentation

## 2014-09-11 DIAGNOSIS — Z8505 Personal history of malignant neoplasm of liver: Secondary | ICD-10-CM | POA: Insufficient documentation

## 2014-09-11 DIAGNOSIS — I252 Old myocardial infarction: Secondary | ICD-10-CM | POA: Insufficient documentation

## 2014-09-11 DIAGNOSIS — Z8669 Personal history of other diseases of the nervous system and sense organs: Secondary | ICD-10-CM | POA: Insufficient documentation

## 2014-09-11 DIAGNOSIS — M5432 Sciatica, left side: Secondary | ICD-10-CM

## 2014-09-11 DIAGNOSIS — H538 Other visual disturbances: Secondary | ICD-10-CM | POA: Insufficient documentation

## 2014-09-11 MED ORDER — TETRACAINE HCL 0.5 % OP SOLN
1.0000 [drp] | Freq: Once | OPHTHALMIC | Status: AC
  Administered 2014-09-11: 2 [drp] via OPHTHALMIC
  Filled 2014-09-11: qty 2

## 2014-09-11 MED ORDER — OXYCODONE HCL 5 MG PO TABS: 5.0000 mg | ORAL_TABLET | Freq: Four times a day (QID) | ORAL | Status: DC | PRN

## 2014-09-11 MED ORDER — TETRACAINE HCL 0.5 % OP SOLN: 2.0000 [drp] | Freq: Once | OPHTHALMIC | Status: DC

## 2014-09-11 MED ORDER — NAPROXEN 500 MG PO TABS: 500.0000 mg | ORAL_TABLET | Freq: Two times a day (BID) | ORAL | Status: DC

## 2014-09-11 NOTE — ED Notes (Signed)
Pt c/o left back and hip pain x years that is worse over last several days

## 2014-09-11 NOTE — ED Provider Notes (Signed)
CSN: [id number]     Arrival date & time 09/11/14  1324 History   First MD Initiated Contact with Patient 09/11/14 1801     Chief Complaint  Patient presents with  . Back Pain  . Hip Pain     (Consider location/radiation/quality/duration/timing/severity/associated sxs/prior Treatment) Patient is a 63 y.o. male presenting with back pain and hip pain. The history is provided by the patient and medical records. No language interpreter was used.  Back Pain Location:  Lumbar spine Quality:  Shooting Radiates to:  L thigh Pain severity:  Severe Pain is:  Same all the time Onset quality:  Gradual Duration:  12 months Timing:  Constant Progression:  Worsening Chronicity:  Chronic Ineffective treatments:  Bed rest Associated symptoms: no bladder incontinence, no bowel incontinence, no numbness, no paresthesias and no perianal numbness   Hip Pain Pertinent negatives include no numbness.    Past Medical History  Diagnosis Date  . Thyroid disease   . CVA (cerebral infarction)     2011  . Syncope and collapse     "here lately" (07/07/2013)  . Cirrhosis of liver     "one step up from cirrhosis" (07/07/2013)  . MI, old 2011  . Lower extremity numbness     "feet and legs go to sleep randomly" (07/07/2013)  . Chronic bronchitis     "spring q yr; comes w/the pollen" (07/07/2013)  . Stroke 2011    "weak on my right arm & leg since" (07/07/2013)  . Hepatitis C virus     "A, B, & C" (07/07/2013)  . Hepatitis A   . Hepatitis B   . Headache(784.0)     "1-2 days q week; from stress" (07/07/2013"  . Epilepsy     "epilepsy fits in my teens; grew out of them before I went into the [facility]" (07/07/2013)  . Depression   . Liver cancer     "been diagnosed w/moderate liver cancer" (07/07/2013)  . Traumatic closed displaced fracture of multiple ribs of left side     /notes 07/07/2013; "fell ~ 1 wk ago" (07/07/2013)   Past Surgical History  Procedure Laterality Date  . Liver surgery   2011    "went in to kill 2 spots on my liver; killed one of them; never did kill the 2nd one" (07/07/2013)   History reviewed. No pertinent family history. History  Substance Use Topics  . Smoking status: Former Smoker -- 1.00 packs/day for 25 years    Types: Cigarettes  . Smokeless tobacco: Never Used     Comment: 07/07/2013 "quit smoking cigarettes ~ 20 yr ago"  . Alcohol Use: 3.6 oz/week    6 Cans of beer per week     Comment: 10/24 "drink ~ 6 pack of beer/wk now; 12 pack of beer/night for 3 years while in the [employer]" (07/07/2013)    Review of Systems  Eyes: Positive for visual disturbance.       Blurred vision in right eye x 1 month.  Gastrointestinal: Negative for bowel incontinence.  Genitourinary: Negative for bladder incontinence.  Musculoskeletal: Positive for back pain.  Neurological: Negative for numbness and paresthesias.  All other systems reviewed and are negative.     Allergies  Penicillins  Home Medications   Prior to Admission medications   Medication Sig Start Date End Date Taking? Authorizing Provider  Multiple Vitamin (MULTIVITAMIN WITH MINERALS) TABS Take 1 tablet by mouth daily.   Yes Historical Provider, MD  mupirocin ointment (BACTROBAN) 2 % Apply 1 application  topically 2 (two) times daily. 05/11/14  Yes Liam Graham, PA-C  sulfamethoxazole-trimethoprim (SEPTRA DS) 800-160 MG per tablet Take 2 tablets by mouth 2 (two) times daily. 05/11/14  Yes Zachary H Baker, PA-C   BP 137/76 mmHg  Pulse 70  Temp(Src) 98 F (36.7 C) (Oral)  Resp 20  SpO2 100% Physical Exam  Constitutional: He is oriented to person, place, and time. He appears cachectic. He is cooperative.  HENT:  Head: Normocephalic.  Eyes: EOM are normal. Scleral icterus is present.  Fundoscopic exam:      The right eye shows red reflex.       The left eye shows red reflex.  Intraocular pressure assessed via tonopen-- 11 mm hg.  Neck: Neck supple.  Cardiovascular: Normal rate and  regular rhythm.   Pulmonary/Chest: Effort normal and breath sounds normal.  Abdominal: Soft. Bowel sounds are normal.  Musculoskeletal: He exhibits tenderness. He exhibits no edema.       Lumbar back: He exhibits pain.  Left sided low back pain radiating to left hip.  Increased pain with SLR.   Lymphadenopathy:    He has no cervical adenopathy.  Neurological: He is alert and oriented to person, place, and time.  Skin: Skin is warm and dry.  Psychiatric: He has a normal mood and affect.  Nursing note and vitals reviewed.   ED Course  Procedures (including critical care time) Labs Review Labs Reviewed - No data to display  Imaging Review No results found.   EKG Interpretation None      MDM   Final diagnoses:  None    Sciatica. No red flag symptoms.  Anti-inflammatory, analgesic.  Follow-up with PCP at [facility] as scheduled on September 19, 2014. Blurred vision x 1 month.  No visual field deficits.  IOP 11 mm hg--opthalmology referral.     Norman Herrlich, NP 09/12/14 [id number]  Virgel Manifold, MD 09/13/14 [phone]

## 2014-09-11 NOTE — Discharge Instructions (Signed)
Blurred Vision You have been seen today complaining of blurred vision. This means you have a loss of ability to see small details.  CAUSES  Blurred vision can be a symptom of underlying eye problems, such as:  Aging of the eye (presbyopia).  Glaucoma.  Cataracts.  Eye infection.  Eye-related migraine.  Diabetes mellitus.  Fatigue.  Migraine headaches.  High blood pressure.  Breakdown of the back of the eye (macular degeneration).  Problems caused by some medications. The most common cause of blurred vision is the need for eyeglasses or a new prescription. Today in the emergency department, no cause for your blurred vision can be found. SYMPTOMS  Blurred vision is the loss of visual sharpness and detail (acuity). DIAGNOSIS  Should blurred vision continue, you should see your caregiver. If your caregiver is your primary care physician, he or she may choose to refer you to another specialist.  TREATMENT  Do not ignore your blurred vision. Make sure to have it checked out to see if further treatment or referral is necessary. SEEK MEDICAL CARE IF:  You are unable to get into a specialist so we can help you with a referral. SEEK IMMEDIATE MEDICAL CARE IF: You have severe eye pain, severe headache, or sudden loss of vision. MAKE SURE YOU:   Understand these instructions.  Will watch your condition.  Will get help right away if you are not doing well or get worse. Document Released: 09/03/2003 Document Revised: 11/23/2011 Document Reviewed: 04/04/2008 [facility] Patient Information 2015 [place], [place]. This information is not intended to replace advice given to you by your health care provider. Make sure you discuss any questions you have with your health care provider. Sciatica Sciatica is pain, weakness, numbness, or tingling along the path of the sciatic nerve. The nerve starts in the lower back and runs down the back of each leg. The nerve controls the muscles in the lower  leg and in the back of the knee, while also providing sensation to the back of the thigh, lower leg, and the sole of your foot. Sciatica is a symptom of another medical condition. For instance, nerve damage or certain conditions, such as a herniated disk or bone spur on the spine, pinch or put pressure on the sciatic nerve. This causes the pain, weakness, or other sensations normally associated with sciatica. Generally, sciatica only affects one side of the body. CAUSES   Herniated or slipped disc.  Degenerative disk disease.  A pain disorder involving the narrow muscle in the buttocks (piriformis syndrome).  Pelvic injury or fracture.  Pregnancy.  Tumor (rare). SYMPTOMS  Symptoms can vary from mild to very severe. The symptoms usually travel from the low back to the buttocks and down the back of the leg. Symptoms can include:  Mild tingling or dull aches in the lower back, leg, or hip.  Numbness in the back of the calf or sole of the foot.  Burning sensations in the lower back, leg, or hip.  Sharp pains in the lower back, leg, or hip.  Leg weakness.  Severe back pain inhibiting movement. These symptoms may get worse with coughing, sneezing, laughing, or prolonged sitting or standing. Also, being overweight may worsen symptoms. DIAGNOSIS  Your caregiver will perform a physical exam to look for common symptoms of sciatica. He or she may ask you to do certain movements or activities that would trigger sciatic nerve pain. Other tests may be performed to find the cause of the sciatica. These may include:  Blood tests.  X-rays.  Imaging tests, such as an MRI or CT scan. TREATMENT  Treatment is directed at the cause of the sciatic pain. Sometimes, treatment is not necessary and the pain and discomfort goes away on its own. If treatment is needed, your caregiver may suggest:  Over-the-counter medicines to relieve pain.  Prescription medicines, such as anti-inflammatory medicine,  muscle relaxants, or narcotics.  Applying heat or ice to the painful area.  Steroid injections to lessen pain, irritation, and inflammation around the nerve.  Reducing activity during periods of pain.  Exercising and stretching to strengthen your abdomen and improve flexibility of your spine. Your caregiver may suggest losing weight if the extra weight makes the back pain worse.  Physical therapy.  Surgery to eliminate what is pressing or pinching the nerve, such as a bone spur or part of a herniated disk. HOME CARE INSTRUCTIONS   Only take over-the-counter or prescription medicines for pain or discomfort as directed by your caregiver.  Apply ice to the affected area for 20 minutes, 3-4 times a day for the first 48-72 hours. Then try heat in the same way.  Exercise, stretch, or perform your usual activities if these do not aggravate your pain.  Attend physical therapy sessions as directed by your caregiver.  Keep all follow-up appointments as directed by your caregiver.  Do not wear high heels or shoes that do not provide proper support.  Check your mattress to see if it is too soft. A firm mattress may lessen your pain and discomfort. SEEK IMMEDIATE MEDICAL CARE IF:   You lose control of your bowel or bladder (incontinence).  You have increasing weakness in the lower back, pelvis, buttocks, or legs.  You have redness or swelling of your back.  You have a burning sensation when you urinate.  You have pain that gets worse when you lie down or awakens you at night.  Your pain is worse than you have experienced in the past.  Your pain is lasting longer than 4 weeks.  You are suddenly losing weight without reason. MAKE SURE YOU:  Understand these instructions.  Will watch your condition.  Will get help right away if you are not doing well or get worse. Document Released: 08/25/2001 Document Revised: 03/01/2012 Document Reviewed: 01/10/2012 [facility] Patient  Information 2015 [place], [place]. This information is not intended to replace advice given to you by your health care provider. Make sure you discuss any questions you have with your health care provider.

## 2014-09-11 NOTE — ED Notes (Signed)
Pt a/o x 4 on d/c with steady gait, pt refused wheelchair.

## 2014-09-11 NOTE — ED Notes (Signed)
Pt now c/o blurry vision in right eye x 1 month

## 2014-10-04 ENCOUNTER — Emergency Department (HOSPITAL_COMMUNITY)
Admission: EM | Admit: 2014-10-04 | Discharge: 2014-10-04 | Disposition: A | Discharge status: Home / Self Care | Payer: Non-veteran care | Attending: Emergency Medicine | Admitting: Emergency Medicine

## 2014-10-04 ENCOUNTER — Encounter (HOSPITAL_COMMUNITY): Payer: Self-pay

## 2014-10-04 DIAGNOSIS — R531 Weakness: Secondary | ICD-10-CM | POA: Insufficient documentation

## 2014-10-04 DIAGNOSIS — Z87891 Personal history of nicotine dependence: Secondary | ICD-10-CM | POA: Insufficient documentation

## 2014-10-04 DIAGNOSIS — Z791 Long term (current) use of non-steroidal anti-inflammatories (NSAID): Secondary | ICD-10-CM | POA: Diagnosis not present

## 2014-10-04 DIAGNOSIS — Z8709 Personal history of other diseases of the respiratory system: Secondary | ICD-10-CM | POA: Insufficient documentation

## 2014-10-04 DIAGNOSIS — H538 Other visual disturbances: Secondary | ICD-10-CM | POA: Diagnosis not present

## 2014-10-04 DIAGNOSIS — M79605 Pain in left leg: Secondary | ICD-10-CM

## 2014-10-04 DIAGNOSIS — Z79899 Other long term (current) drug therapy: Secondary | ICD-10-CM | POA: Insufficient documentation

## 2014-10-04 DIAGNOSIS — Z792 Long term (current) use of antibiotics: Secondary | ICD-10-CM | POA: Diagnosis not present

## 2014-10-04 DIAGNOSIS — I252 Old myocardial infarction: Secondary | ICD-10-CM | POA: Diagnosis not present

## 2014-10-04 DIAGNOSIS — Z880 Allergy status to penicillin: Secondary | ICD-10-CM | POA: Insufficient documentation

## 2014-10-04 DIAGNOSIS — Z8505 Personal history of malignant neoplasm of liver: Secondary | ICD-10-CM | POA: Diagnosis not present

## 2014-10-04 DIAGNOSIS — Z8639 Personal history of other endocrine, nutritional and metabolic disease: Secondary | ICD-10-CM | POA: Diagnosis not present

## 2014-10-04 DIAGNOSIS — Z8719 Personal history of other diseases of the digestive system: Secondary | ICD-10-CM | POA: Insufficient documentation

## 2014-10-04 DIAGNOSIS — Z8781 Personal history of (healed) traumatic fracture: Secondary | ICD-10-CM | POA: Diagnosis not present

## 2014-10-04 DIAGNOSIS — R200 Anesthesia of skin: Secondary | ICD-10-CM | POA: Diagnosis not present

## 2014-10-04 DIAGNOSIS — Z8659 Personal history of other mental and behavioral disorders: Secondary | ICD-10-CM | POA: Diagnosis not present

## 2014-10-04 DIAGNOSIS — Z8673 Personal history of transient ischemic attack (TIA), and cerebral infarction without residual deficits: Secondary | ICD-10-CM | POA: Diagnosis not present

## 2014-10-04 DIAGNOSIS — Z8619 Personal history of other infectious and parasitic diseases: Secondary | ICD-10-CM | POA: Diagnosis not present

## 2014-10-04 LAB — DIFFERENTIAL
BASOS ABS: 0 10*3/uL (ref 0.0–0.1)
Basophils Relative: 0 % (ref 0–1)
EOS PCT: 2 % (ref 0–5)
Eosinophils Absolute: 0.1 10*3/uL (ref 0.0–0.7)
LYMPHS PCT: 19 % (ref 12–46)
Lymphs Abs: 1.4 10*3/uL (ref 0.7–4.0)
Monocytes Absolute: 0.9 10*3/uL (ref 0.1–1.0)
Monocytes Relative: 12 % (ref 3–12)
NEUTROS ABS: 4.8 10*3/uL (ref 1.7–7.7)
Neutrophils Relative %: 67 % (ref 43–77)

## 2014-10-04 LAB — CBC
HCT: 38.8 % — ABNORMAL LOW (ref 39.0–52.0)
HEMOGLOBIN: 13.4 g/dL (ref 13.0–17.0)
MCH: 31.9 pg (ref 26.0–34.0)
MCHC: 34.5 g/dL (ref 30.0–36.0)
MCV: 92.4 fL (ref 78.0–100.0)
Platelets: 160 10*3/uL (ref 150–400)
RBC: 4.2 MIL/uL — AB (ref 4.22–5.81)
RDW: 13.2 % (ref 11.5–15.5)
WBC: 7.2 10*3/uL (ref 4.0–10.5)

## 2014-10-04 LAB — COMPREHENSIVE METABOLIC PANEL
ALBUMIN: 2.6 g/dL — AB (ref 3.5–5.2)
ALT: 59 U/L — AB (ref 0–53)
AST: 75 U/L — ABNORMAL HIGH (ref 0–37)
Alkaline Phosphatase: 126 U/L — ABNORMAL HIGH (ref 39–117)
Anion gap: 6 (ref 5–15)
BUN: 10 mg/dL (ref 6–23)
CO2: 26 mmol/L (ref 19–32)
CREATININE: 1.05 mg/dL (ref 0.50–1.35)
Calcium: 8.9 mg/dL (ref 8.4–10.5)
Chloride: 105 mEq/L (ref 96–112)
GFR calc Af Amer: 85 mL/min — ABNORMAL LOW (ref >90)
GFR calc non Af Amer: 73 mL/min — ABNORMAL LOW (ref >90)
Glucose, Bld: 94 mg/dL (ref 70–99)
POTASSIUM: 3.9 mmol/L (ref 3.5–5.1)
SODIUM: 137 mmol/L (ref 135–145)
Total Bilirubin: 1.2 mg/dL (ref 0.3–1.2)
Total Protein: 9 g/dL — ABNORMAL HIGH (ref 6.0–8.3)

## 2014-10-04 LAB — PROTIME-INR
INR: 1.26 (ref 0.00–1.49)
Prothrombin Time: 15.9 seconds — ABNORMAL HIGH (ref 11.6–15.2)

## 2014-10-04 LAB — I-STAT TROPONIN, ED: Troponin i, poc: 0 ng/mL (ref 0.00–0.08)

## 2014-10-04 LAB — APTT: aPTT: 34 seconds (ref 24–37)

## 2014-10-04 LAB — ETHANOL

## 2014-10-04 MED ORDER — ASPIRIN 81 MG PO CHEW
81.0000 mg | CHEWABLE_TABLET | Freq: Once | ORAL | Status: AC
  Administered 2014-10-04: 81 mg via ORAL
  Filled 2014-10-04: qty 1

## 2014-10-04 MED ORDER — ASPIRIN 81 MG PO CHEW: 81.0000 mg | CHEWABLE_TABLET | Freq: Every day | ORAL | Status: AC

## 2014-10-04 MED ORDER — OXYCODONE-ACETAMINOPHEN 5-325 MG PO TABS
1.0000 | ORAL_TABLET | Freq: Once | ORAL | Status: AC
  Administered 2014-10-04: 1 via ORAL
  Filled 2014-10-04: qty 1

## 2014-10-04 MED ORDER — OXYCODONE HCL 5 MG PO TABS: 5.0000 mg | ORAL_TABLET | ORAL | Status: DC | PRN

## 2014-10-04 MED ORDER — KETOROLAC TROMETHAMINE 30 MG/ML IJ SOLN
30.0000 mg | Freq: Once | INTRAMUSCULAR | Status: AC
  Administered 2014-10-04: 30 mg via INTRAMUSCULAR
  Filled 2014-10-04: qty 1

## 2014-10-04 NOTE — ED Notes (Signed)
Pt placed into gown and on monitor upon arrival to room. Pt monitored by blood pressure, pulse ox, and 12 lead. Pts EKG given to and signed by Dr. Campos.  

## 2014-10-04 NOTE — ED Notes (Signed)
Rt. Eye blurred vision for 2 months.  Pt. Had the eye checked and was negative for glaucoma .

## 2014-10-04 NOTE — Discharge Instructions (Signed)
Please read and follow all provided instructions.  Your diagnoses today include:  1. Pain of left lower extremity   2. Weakness    Tests performed today include:  Vital signs - see below for your results today  Blood counts and electrolytes  Urine test  Medications prescribed:   Oxycodone - narcotic pain medication  DO NOT drive or perform any activities that require you to be awake and alert because this medicine can make you drowsy.   Take any prescribed medications only as directed.  Home care instructions:   Follow any educational materials contained in this packet  Please rest, use ice or heat on your back for the next several days  Do not lift, push, pull anything more than 10 pounds for the next week  Follow-up instructions: Please follow-up with your primary care provider in the next 1 week for further evaluation of your symptoms.   Return instructions:  SEEK IMMEDIATE MEDICAL ATTENTION IF YOU HAVE:  New numbness, tingling, weakness, or problem with the use of your arms or legs  Severe back pain not relieved with medications  Loss control of your bowels or bladder  Increasing pain in any areas of the body (such as chest or abdominal pain)  Shortness of breath, dizziness, or fainting.   Worsening nausea (feeling sick to your stomach), vomiting, fever, or sweats  Any other emergent concerns regarding your health   Additional Information:  Your vital signs today were: BP 127/87 mmHg   Pulse 92   Temp(Src) 98.3 F (36.8 C) (Oral)   Resp 14   Ht 5\' 7"  (1.702 m)   Wt 126 lb (57.153 kg)   BMI 19.73 kg/m2   SpO2 100% If your blood pressure (BP) was elevated above 135/85 this visit, please have this repeated by your doctor within one month. --------------

## 2014-10-04 NOTE — ED Notes (Addendum)
Pt. Reports having lt. Side numbness from his lt. Arm down to his lt. Leg.   Began 4 days ago. Was seen by his PCP and was diagnosed with Sciatica and was placed on Tramadol.  Pt. Had a reaction to it. PCP dc the medication over the phone and has not ordered any pain medication.   Pt. Was picked up at the [facility] [place]. Pt. Is alert and oriented X4.  HX of stroke 11 years ago with Lt . Side weakness.  Pt. Reports that the pain and burning radiates into your testicles and penis. Denies any penile drainage.  Pt. s PCP believes this is from the TRamadol and this why he stoppped the medication

## 2014-10-04 NOTE — ED Notes (Signed)
Pt. Is aware of needing an urine specimen 

## 2014-10-04 NOTE — ED Provider Notes (Signed)
CSN: [id number]     Arrival date & time 10/04/14  1339 History   First MD Initiated Contact with Patient 10/04/14 1348     Chief Complaint  Patient presents with  . Weakness     (Consider location/radiation/quality/duration/timing/severity/associated sxs/prior Treatment) HPI Comments: Patient presents with complaint of left lower extremity pain, numbness and weakness as well as left upper extremity tingling and weakness for the past 1 week. Patient has had lower back pain like this in the past diagnosed as sciatica. He states he has not had upper extremity weakness before. He describes dropping items from left hand. States his left leg has given out in the past. No fever or URI symptoms, chest pain, shortness of breath, abdominal pain, nausea, vomiting, diarrhea. Patient reports some burning pain in his penis and testicles without drainage or discharge. No rash or injury to the area. Patient has seen his PCP and was prescribed tramadol. He stopped the medication because he thought the testicular symptoms were from the pain medication. Patient denies other signs of stroke including: facial droop, slurred speech, aphasia, imbalance/trouble walking.   Patient is a 64 y.o. male presenting with weakness. The history is provided by the patient.  Weakness Associated symptoms include numbness and weakness. Pertinent negatives include no chest pain, congestion, fever, headaches, nausea, neck pain, rash or vomiting.    Past Medical History  Diagnosis Date  . Thyroid disease   . CVA (cerebral infarction)     2011  . Syncope and collapse     "here lately" (07/07/2013)  . Cirrhosis of liver     "one step up from cirrhosis" (07/07/2013)  . MI, old 2011  . Lower extremity numbness     "feet and legs go to sleep randomly" (07/07/2013)  . Chronic bronchitis     "spring q yr; comes w/the pollen" (07/07/2013)  . Stroke 2011    "weak on my right arm & leg since" (07/07/2013)  . Hepatitis C virus    "A, B, & C" (07/07/2013)  . Hepatitis A   . Hepatitis B   . Headache(784.0)     "1-2 days q week; from stress" (07/07/2013"  . Epilepsy     "epilepsy fits in my teens; grew out of them before I went into the [facility]" (07/07/2013)  . Depression   . Liver cancer     "been diagnosed w/moderate liver cancer" (07/07/2013)  . Traumatic closed displaced fracture of multiple ribs of left side     /notes 07/07/2013; "fell ~ 1 wk ago" (07/07/2013)   Past Surgical History  Procedure Laterality Date  . Liver surgery  2011    "went in to kill 2 spots on my liver; killed one of them; never did kill the 2nd one" (07/07/2013)   No family history on file. History  Substance Use Topics  . Smoking status: Former Smoker -- 1.00 packs/day for 25 years    Types: Cigarettes  . Smokeless tobacco: Never Used     Comment: 07/07/2013 "quit smoking cigarettes ~ 20 yr ago"  . Alcohol Use: 3.6 oz/week    6 Cans of beer per week     Comment: 10/24 "drink ~ 6 pack of beer/wk now; 12 pack of beer/night for 3 years while in the [employer]" (07/07/2013)    Review of Systems  Constitutional: Negative for fever.  HENT: Negative for congestion, dental problem, rhinorrhea and sinus pressure.   Eyes: Positive for visual disturbance (blurry vision in R eye x several months). Negative for  photophobia, discharge and redness.  Respiratory: Negative for shortness of breath.   Cardiovascular: Negative for chest pain.  Gastrointestinal: Negative for nausea and vomiting.  Musculoskeletal: Positive for gait problem. Negative for neck pain and neck stiffness.  Skin: Negative for rash.  Neurological: Positive for weakness and numbness. Negative for syncope, speech difficulty, light-headedness and headaches.  Psychiatric/Behavioral: Negative for confusion.      Allergies  Penicillins  Home Medications   Prior to Admission medications   Medication Sig Start Date End Date Taking? Authorizing Provider  Multiple Vitamin  (MULTIVITAMIN WITH MINERALS) TABS Take 1 tablet by mouth daily.    Historical Provider, MD  mupirocin ointment (BACTROBAN) 2 % Apply 1 application topically 2 (two) times daily. 05/11/14   Liam Graham, PA-C  naproxen (NAPROSYN) 500 MG tablet Take 1 tablet (500 mg total) by mouth 2 (two) times daily. 09/11/14   Norman Herrlich, NP  oxyCODONE (ROXICODONE) 5 MG immediate release tablet Take 1 tablet (5 mg total) by mouth every 6 (six) hours as needed for severe pain. 09/11/14   Norman Herrlich, NP  sulfamethoxazole-trimethoprim (SEPTRA DS) 800-160 MG per tablet Take 2 tablets by mouth 2 (two) times daily. 05/11/14   Freeman Caldron Baker, PA-C   BP 127/87 mmHg  Pulse 92  Temp(Src) 98.3 F (36.8 C) (Oral)  Resp 14  Ht 5\' 7"  (1.702 m)  Wt 126 lb (57.153 kg)  BMI 19.73 kg/m2  SpO2 100%   Physical Exam  Constitutional: He is oriented to person, place, and time. He appears well-developed and well-nourished.  HENT:  Head: Normocephalic and atraumatic.  Right Ear: Tympanic membrane, external ear and ear canal normal.  Left Ear: Tympanic membrane, external ear and ear canal normal.  Nose: Nose normal.  Mouth/Throat: Uvula is midline, oropharynx is clear and moist and mucous membranes are normal.  Eyes: Conjunctivae, EOM and lids are normal. Pupils are equal, round, and reactive to light.  Neck: Normal range of motion. Neck supple.  Cardiovascular: Normal rate and regular rhythm.   Pulmonary/Chest: Effort normal and breath sounds normal.  Abdominal: Soft. There is no tenderness.  Musculoskeletal: Normal range of motion.       Cervical back: He exhibits normal range of motion, no tenderness and no bony tenderness.  Neurological: He is alert and oriented to person, place, and time. He has normal reflexes. No cranial nerve deficit or sensory deficit. He exhibits normal muscle tone. He displays a negative Romberg sign. Coordination and gait normal. GCS eye subscore is 4. GCS verbal subscore is 5. GCS  motor subscore is 6.  Trace decrease strength L upper and lower extremities flexors/extensors compared to R.   Skin: Skin is warm and dry.  Psychiatric: He has a normal mood and affect.  Nursing note and vitals reviewed.   ED Course  Procedures (including critical care time) Labs Review Labs Reviewed  PROTIME-INR - Abnormal; Notable for the following:    Prothrombin Time 15.9 (*)    All other components within normal limits  CBC - Abnormal; Notable for the following:    RBC 4.20 (*)    HCT 38.8 (*)    All other components within normal limits  APTT  DIFFERENTIAL  ETHANOL  COMPREHENSIVE METABOLIC PANEL  URINE RAPID DRUG SCREEN (HOSP PERFORMED)  URINALYSIS, ROUTINE W REFLEX MICROSCOPIC  I-STAT TROPOININ, ED    Imaging Review No results found.   EKG Interpretation None      3:07 PM Patient seen and examined. Work-up initiated.  Medications ordered.   Vital signs reviewed and are as follows: BP 127/87 mmHg  Pulse 92  Temp(Src) 98.3 F (36.8 C) (Oral)  Resp 14  Ht 5\' 7"  (1.702 m)  Wt 126 lb (57.153 kg)  BMI 19.73 kg/m2  SpO2 100%   Lab work-up is unremarkable. Patient discussed with and seen by Dr. Venora Maples.  Initially plan was for MRI brain given weakness complaints to rule-out stroke.   However, Dr. Venora Maples saw patient and he now only complains of left lower extremity pain and no weakness. Imaging canceled. Patient treated for sciatica like pain.   D/c to home with pain medication and aspirin 81mg  qd.   Patient counseled on use of narcotic pain medications. Counseled not to combine these medications with others containing tylenol. Urged not to drink alcohol, drive, or perform any other activities that requires focus while taking these medications. The patient verbalizes understanding and agrees with the plan.  No red flag s/s of low back pain. Patient was counseled on back pain precautions and told to do activity as tolerated but do not lift, push, or pull heavy  objects more than 10 pounds for the next week.  Patient counseled to use ice or heat on back for no longer than 15 minutes every hour.   Patient urged to follow-up with PCP if pain does not improve with treatment and rest or if pain becomes recurrent. Urged to return with worsening severe pain, loss of bowel or bladder control, trouble walking.   The patient verbalizes understanding and agrees with the plan.   MDM   Final diagnoses:  Weakness  Pain of left lower extremity   Patient with back and leg pain. Initially complained of upper and lower extremity weakness to me but his complaint has changed during ED stay. Patient seen by and discussed with Dr. Venora Maples. Patient is ambulatory. No warning symptoms of back pain including: fecal incontinence, urinary retention or overflow incontinence, night sweats, waking from sleep with back pain, unexplained fevers or weight loss, h/o cancer, IVDU, recent trauma. No concern for cauda equina, epidural abscess, or other serious cause of back pain. Conservative measures such as rest, ice/heat and pain medicine indicated with PCP follow-up if no improvement with conservative management.      Carlisle Cater, PA-C 10/05/14 [place], MD 10/05/14 [phone]

## 2014-10-28 ENCOUNTER — Encounter (HOSPITAL_COMMUNITY): Payer: Self-pay | Admitting: Physical Medicine and Rehabilitation

## 2014-10-28 ENCOUNTER — Emergency Department (HOSPITAL_COMMUNITY)
Admission: EM | Admit: 2014-10-28 | Discharge: 2014-10-28 | Disposition: A | Discharge status: Home / Self Care | Payer: Non-veteran care | Attending: Emergency Medicine | Admitting: Emergency Medicine

## 2014-10-28 DIAGNOSIS — Z8709 Personal history of other diseases of the respiratory system: Secondary | ICD-10-CM | POA: Diagnosis not present

## 2014-10-28 DIAGNOSIS — Z8673 Personal history of transient ischemic attack (TIA), and cerebral infarction without residual deficits: Secondary | ICD-10-CM | POA: Insufficient documentation

## 2014-10-28 DIAGNOSIS — Z79899 Other long term (current) drug therapy: Secondary | ICD-10-CM | POA: Insufficient documentation

## 2014-10-28 DIAGNOSIS — I252 Old myocardial infarction: Secondary | ICD-10-CM | POA: Diagnosis not present

## 2014-10-28 DIAGNOSIS — Z8719 Personal history of other diseases of the digestive system: Secondary | ICD-10-CM | POA: Insufficient documentation

## 2014-10-28 DIAGNOSIS — Z7982 Long term (current) use of aspirin: Secondary | ICD-10-CM | POA: Insufficient documentation

## 2014-10-28 DIAGNOSIS — Z87891 Personal history of nicotine dependence: Secondary | ICD-10-CM | POA: Diagnosis not present

## 2014-10-28 DIAGNOSIS — M549 Dorsalgia, unspecified: Secondary | ICD-10-CM | POA: Diagnosis present

## 2014-10-28 DIAGNOSIS — Z8639 Personal history of other endocrine, nutritional and metabolic disease: Secondary | ICD-10-CM | POA: Diagnosis not present

## 2014-10-28 DIAGNOSIS — Z8505 Personal history of malignant neoplasm of liver: Secondary | ICD-10-CM | POA: Insufficient documentation

## 2014-10-28 DIAGNOSIS — Z8669 Personal history of other diseases of the nervous system and sense organs: Secondary | ICD-10-CM | POA: Diagnosis not present

## 2014-10-28 DIAGNOSIS — Z87828 Personal history of other (healed) physical injury and trauma: Secondary | ICD-10-CM | POA: Diagnosis not present

## 2014-10-28 DIAGNOSIS — Z880 Allergy status to penicillin: Secondary | ICD-10-CM | POA: Insufficient documentation

## 2014-10-28 DIAGNOSIS — M5430 Sciatica, unspecified side: Secondary | ICD-10-CM | POA: Diagnosis not present

## 2014-10-28 DIAGNOSIS — Z8659 Personal history of other mental and behavioral disorders: Secondary | ICD-10-CM | POA: Insufficient documentation

## 2014-10-28 MED ORDER — KETOROLAC TROMETHAMINE 60 MG/2ML IM SOLN
60.0000 mg | Freq: Once | INTRAMUSCULAR | Status: AC
  Administered 2014-10-28: 60 mg via INTRAMUSCULAR
  Filled 2014-10-28: qty 2

## 2014-10-28 MED ORDER — PREDNISONE 10 MG PO TABS: ORAL_TABLET | ORAL | Status: DC

## 2014-10-28 MED ORDER — OXYCODONE HCL 5 MG PO TABS: 5.0000 mg | ORAL_TABLET | ORAL | Status: AC | PRN

## 2014-10-28 NOTE — ED Notes (Signed)
Pt verbalized understanding of discharge instructions and prescriptions and has no further questions.

## 2014-10-28 NOTE — ED Provider Notes (Signed)
CSN: [id number]     Arrival date & time 10/28/14  1339 History  This chart was scribed for non-physician practitioner, Fransico Meadow, PA-C,  working with Dorie Rank, MD, by Ian Bushman, ED Scribe. This patient was seen in room TR08C/TR08C and the patient's care was started at 3:29 PM.   Chief Complaint  Patient presents with  . Back Pain     (Consider location/radiation/quality/duration/timing/severity/associated sxs/prior Treatment) Patient is a 64 y.o. male presenting with back pain. The history is provided by the patient. No language interpreter was used.  Back Pain Associated symptoms: no chest pain and no fever     HPI Comments: Cameron Santos is a 64 y.o. male who presents to the Emergency Department complaining of constant worsening back pain onset two weeks ago. Notes that he has had his back x-rayed, and states that they did not see any arthritis or bone disease.  He was given a shot and medication which made him feel better for 6-7 hours but the pain came back.  Patient notes that he goes to the [place] regularly for Hepatitis A, B and C, heart disease, thyroid.  Patient notes that he has sharp pains in his testicles occasionally.  He notes that his pains have given him suicidal thoughts in the past but he currently does not have suicidal thoughts today.   Notes he has lost 4-5 pounds recently and has no appetite.  Patient does not smoke but does drink liquor.   Past Medical History  Diagnosis Date  . Thyroid disease   . CVA (cerebral infarction)     2011  . Syncope and collapse     "here lately" (07/07/2013)  . Cirrhosis of liver     "one step up from cirrhosis" (07/07/2013)  . MI, old 2011  . Lower extremity numbness     "feet and legs go to sleep randomly" (07/07/2013)  . Chronic bronchitis     "spring q yr; comes w/the pollen" (07/07/2013)  . Stroke 2011    "weak on my right arm & leg since" (07/07/2013)  . Hepatitis C virus     "A, B, & C" (07/07/2013)  . Hepatitis A   .  Hepatitis B   . Headache(784.0)     "1-2 days q week; from stress" (07/07/2013"  . Epilepsy     "epilepsy fits in my teens; grew out of them before I went into the [facility]" (07/07/2013)  . Depression   . Liver cancer     "been diagnosed w/moderate liver cancer" (07/07/2013)  . Traumatic closed displaced fracture of multiple ribs of left side     /notes 07/07/2013; "fell ~ 1 wk ago" (07/07/2013)   Past Surgical History  Procedure Laterality Date  . Liver surgery  2011    "went in to kill 2 spots on my liver; killed one of them; never did kill the 2nd one" (07/07/2013)   No family history on file. History  Substance Use Topics  . Smoking status: Former Smoker -- 1.00 packs/day for 25 years    Types: Cigarettes  . Smokeless tobacco: Never Used     Comment: 07/07/2013 "quit smoking cigarettes ~ 20 yr ago"  . Alcohol Use: 3.6 oz/week    6 Cans of beer per week     Comment: 10/24 "drink ~ 6 pack of beer/wk now; 12 pack of beer/night for 3 years while in the [place]" (07/07/2013)    Review of Systems  Constitutional: Positive for appetite change. Negative for  fever and chills.  Respiratory: Negative for chest tightness.   Cardiovascular: Negative for chest pain.  Musculoskeletal: Positive for back pain.      Allergies  Penicillins  Home Medications   Prior to Admission medications   Medication Sig Start Date End Date Taking? Authorizing Provider  aspirin 81 MG chewable tablet Chew 1 tablet (81 mg total) by mouth daily. 10/04/14   Carlisle Cater, PA-C  Fish Oil-Cholecalciferol (FISH OIL + D3 PO) Take 1,000 Units by mouth daily.    Historical Provider, MD  Multiple Vitamin (MULTIVITAMIN WITH MINERALS) TABS Take 1 tablet by mouth daily.    Historical Provider, MD  naproxen sodium (ANAPROX) 220 MG tablet Take 220 mg by mouth daily as needed (for pain).    Historical Provider, MD  oxyCODONE (OXY IR/ROXICODONE) 5 MG immediate release tablet Take 1 tablet (5 mg total) by mouth every  4 (four) hours as needed for severe pain. 10/04/14   Carlisle Cater, PA-C   BP 129/82 mmHg  Pulse 116  Temp(Src) 97.6 F (36.4 C) (Oral)  Resp 18  SpO2 99% Physical Exam  Constitutional: He is oriented to person, place, and time. He appears well-developed and well-nourished. No distress.  HENT:  Head: Normocephalic and atraumatic.  Eyes: Conjunctivae and EOM are normal.  Neck: Neck supple.  Cardiovascular: Normal rate.   Pulmonary/Chest: Effort normal. No respiratory distress.  Musculoskeletal:  Tender right sciatic notch.  Diffusely tender low back.   Neurological: He is alert and oriented to person, place, and time.  Skin: Skin is warm and dry.  Psychiatric: He has a normal mood and affect. His behavior is normal.  Nursing note and vitals reviewed.   ED Course  Procedures (including critical care time) DIAGNOSTIC STUDIES: Oxygen Saturation is 99% on RA, normal by my interpretation.    COORDINATION OF CARE: 3:36 PM Discussed treatment plan with patient at beside, the patient agrees with the plan and has no further questions at this time.   Labs Review Labs Reviewed - No data to display  Imaging Review No results found.   EKG Interpretation None      MDM  [facility]   [facility]  Pt had xrays and blood work.  Pt also reports depression.  He is scheduled to be seen again this week.  No thoughts of suicide or homicide.  Pt counseled and agrees to return if any thoughts of harm or needs help.   Final diagnoses:  Sciatica, unspecified laterality   No current facility-administered medications for this encounter.  Current outpatient prescriptions:  .  aspirin 81 MG chewable tablet, Chew 1 tablet (81 mg total) by mouth daily., Disp: 30 tablet, Rfl: 0 .  Fish Oil-Cholecalciferol (FISH OIL + D3 PO), Take 1,000 Units by mouth daily., Disp: , Rfl:  .  Multiple Vitamin (MULTIVITAMIN WITH MINERALS) TABS,  Take 1 tablet by mouth daily., Disp: , Rfl:  .  naproxen sodium (ANAPROX) 220 MG tablet, Take 220 mg by mouth daily as needed (for pain)., Disp: , Rfl:  .  oxyCODONE (OXY IR/ROXICODONE) 5 MG immediate release tablet, Take 1 tablet (5 mg total) by mouth every 4 (four) hours as needed for severe pain., Disp: 10 tablet, Rfl: 0 .  predniSONE (DELTASONE) 10 MG tablet, 6,5,4,3,2,1 taper, Disp: 21 tablet, Rfl: 0    I personally performed the services in this documentation, which was scribed in my  presence.  The recorded information has been reviewed and considered.   Ronnald Collum.  Hollace Kinnier [place], PA-C 10/28/14 [place], MD 10/30/14 [phone]

## 2014-10-28 NOTE — ED Notes (Signed)
Pt to department via [facility] for evaluation of lower back pain radiating down L leg. Ongoing x2 weeks. No relief with steroids at home. 9/10 pain upon arrival to ED. Pt is alert and oriented x4. Hepatitis-A, B and C positive.

## 2014-12-04 ENCOUNTER — Emergency Department (HOSPITAL_COMMUNITY): Payer: Non-veteran care

## 2014-12-04 ENCOUNTER — Inpatient Hospital Stay (HOSPITAL_COMMUNITY)
Admission: EM | Admit: 2014-12-04 | Discharge: 2014-12-10 | DRG: 871 | Disposition: A | Discharge status: Skilled Nursing Facility | Payer: Non-veteran care | Attending: Family Medicine | Admitting: Family Medicine

## 2014-12-04 ENCOUNTER — Encounter (HOSPITAL_COMMUNITY): Payer: Self-pay | Admitting: Neurology

## 2014-12-04 DIAGNOSIS — K7460 Unspecified cirrhosis of liver: Secondary | ICD-10-CM | POA: Diagnosis present

## 2014-12-04 DIAGNOSIS — Z7982 Long term (current) use of aspirin: Secondary | ICD-10-CM

## 2014-12-04 DIAGNOSIS — Z7952 Long term (current) use of systemic steroids: Secondary | ICD-10-CM | POA: Diagnosis not present

## 2014-12-04 DIAGNOSIS — E876 Hypokalemia: Secondary | ICD-10-CM | POA: Diagnosis not present

## 2014-12-04 DIAGNOSIS — C228 Malignant neoplasm of liver, primary, unspecified as to type: Secondary | ICD-10-CM | POA: Diagnosis present

## 2014-12-04 DIAGNOSIS — E46 Unspecified protein-calorie malnutrition: Secondary | ICD-10-CM

## 2014-12-04 DIAGNOSIS — Z681 Body mass index (BMI) 19 or less, adult: Secondary | ICD-10-CM

## 2014-12-04 DIAGNOSIS — Z8673 Personal history of transient ischemic attack (TIA), and cerebral infarction without residual deficits: Secondary | ICD-10-CM | POA: Diagnosis not present

## 2014-12-04 DIAGNOSIS — Z9181 History of falling: Secondary | ICD-10-CM

## 2014-12-04 DIAGNOSIS — G8929 Other chronic pain: Secondary | ICD-10-CM | POA: Diagnosis present

## 2014-12-04 DIAGNOSIS — Z87891 Personal history of nicotine dependence: Secondary | ICD-10-CM

## 2014-12-04 DIAGNOSIS — A419 Sepsis, unspecified organism: Secondary | ICD-10-CM | POA: Diagnosis not present

## 2014-12-04 DIAGNOSIS — B159 Hepatitis A without hepatic coma: Secondary | ICD-10-CM | POA: Diagnosis present

## 2014-12-04 DIAGNOSIS — Z880 Allergy status to penicillin: Secondary | ICD-10-CM

## 2014-12-04 DIAGNOSIS — E440 Moderate protein-calorie malnutrition: Secondary | ICD-10-CM | POA: Diagnosis present

## 2014-12-04 DIAGNOSIS — R000 Tachycardia, unspecified: Secondary | ICD-10-CM | POA: Diagnosis present

## 2014-12-04 DIAGNOSIS — E860 Dehydration: Secondary | ICD-10-CM | POA: Insufficient documentation

## 2014-12-04 DIAGNOSIS — I951 Orthostatic hypotension: Secondary | ICD-10-CM | POA: Diagnosis present

## 2014-12-04 DIAGNOSIS — M791 Myalgia: Secondary | ICD-10-CM | POA: Diagnosis present

## 2014-12-04 DIAGNOSIS — R6510 Systemic inflammatory response syndrome (SIRS) of non-infectious origin without acute organ dysfunction: Secondary | ICD-10-CM | POA: Diagnosis not present

## 2014-12-04 DIAGNOSIS — B1910 Unspecified viral hepatitis B without hepatic coma: Secondary | ICD-10-CM | POA: Diagnosis present

## 2014-12-04 DIAGNOSIS — M5440 Lumbago with sciatica, unspecified side: Secondary | ICD-10-CM | POA: Diagnosis present

## 2014-12-04 DIAGNOSIS — F329 Major depressive disorder, single episode, unspecified: Secondary | ICD-10-CM | POA: Diagnosis present

## 2014-12-04 DIAGNOSIS — B1920 Unspecified viral hepatitis C without hepatic coma: Secondary | ICD-10-CM | POA: Diagnosis present

## 2014-12-04 DIAGNOSIS — Z79899 Other long term (current) drug therapy: Secondary | ICD-10-CM

## 2014-12-04 DIAGNOSIS — G40909 Epilepsy, unspecified, not intractable, without status epilepticus: Secondary | ICD-10-CM | POA: Diagnosis present

## 2014-12-04 DIAGNOSIS — E43 Unspecified severe protein-calorie malnutrition: Secondary | ICD-10-CM | POA: Diagnosis present

## 2014-12-04 DIAGNOSIS — I252 Old myocardial infarction: Secondary | ICD-10-CM

## 2014-12-04 DIAGNOSIS — E079 Disorder of thyroid, unspecified: Secondary | ICD-10-CM | POA: Diagnosis present

## 2014-12-04 HISTORY — DX: Dorsalgia, unspecified: M54.9

## 2014-12-04 HISTORY — DX: Other chronic pain: G89.29

## 2014-12-04 HISTORY — DX: Unspecified protein-calorie malnutrition: E46

## 2014-12-04 HISTORY — DX: Systemic inflammatory response syndrome (sirs) of non-infectious origin without acute organ dysfunction: R65.10

## 2014-12-04 HISTORY — DX: Cerebral infarction, unspecified: I63.9

## 2014-12-04 LAB — BASIC METABOLIC PANEL
ANION GAP: 10 (ref 5–15)
BUN: 12 mg/dL (ref 6–23)
CALCIUM: 9.8 mg/dL (ref 8.4–10.5)
CO2: 25 mmol/L (ref 19–32)
Chloride: 100 mmol/L (ref 96–112)
Creatinine, Ser: 0.81 mg/dL (ref 0.50–1.35)
GFR calc Af Amer: >90 mL/min (ref >90)
Glucose, Bld: 115 mg/dL — ABNORMAL HIGH (ref 70–99)
POTASSIUM: 4.3 mmol/L (ref 3.5–5.1)
Sodium: 135 mmol/L (ref 135–145)

## 2014-12-04 LAB — PROTIME-INR
INR: 1.35 (ref 0.00–1.49)
PROTHROMBIN TIME: 16.8 s — AB (ref 11.6–15.2)

## 2014-12-04 LAB — CBC WITH DIFFERENTIAL/PLATELET
BASOS ABS: 0 10*3/uL (ref 0.0–0.1)
Basophils Relative: 0 % (ref 0–1)
EOS PCT: 0 % (ref 0–5)
Eosinophils Absolute: 0 10*3/uL (ref 0.0–0.7)
HCT: 37.2 % — ABNORMAL LOW (ref 39.0–52.0)
Hemoglobin: 12.6 g/dL — ABNORMAL LOW (ref 13.0–17.0)
LYMPHS ABS: 1.7 10*3/uL (ref 0.7–4.0)
Lymphocytes Relative: 15 % (ref 12–46)
MCH: 29.4 pg (ref 26.0–34.0)
MCHC: 33.9 g/dL (ref 30.0–36.0)
MCV: 86.9 fL (ref 78.0–100.0)
Monocytes Absolute: 1.1 10*3/uL — ABNORMAL HIGH (ref 0.1–1.0)
Monocytes Relative: 9 % (ref 3–12)
NEUTROS PCT: 76 % (ref 43–77)
Neutro Abs: 8.8 10*3/uL — ABNORMAL HIGH (ref 1.7–7.7)
Platelets: 362 10*3/uL (ref 150–400)
RBC: 4.28 MIL/uL (ref 4.22–5.81)
RDW: 12.8 % (ref 11.5–15.5)
WBC: 11.7 10*3/uL — AB (ref 4.0–10.5)

## 2014-12-04 LAB — LIPASE, BLOOD: Lipase: 30 U/L (ref 11–59)

## 2014-12-04 LAB — URINALYSIS, ROUTINE W REFLEX MICROSCOPIC
Glucose, UA: NEGATIVE mg/dL
Ketones, ur: 15 mg/dL — AB
LEUKOCYTES UA: NEGATIVE
NITRITE: NEGATIVE
PH: 6 (ref 5.0–8.0)
Protein, ur: NEGATIVE mg/dL
SPECIFIC GRAVITY, URINE: 1.021 (ref 1.005–1.030)
Urobilinogen, UA: 4 mg/dL — ABNORMAL HIGH (ref 0.0–1.0)

## 2014-12-04 LAB — URINE MICROSCOPIC-ADD ON

## 2014-12-04 LAB — ETHANOL: Alcohol, Ethyl (B): <5 mg/dL (ref 0–9)

## 2014-12-04 LAB — RAPID URINE DRUG SCREEN, HOSP PERFORMED
Amphetamines: NOT DETECTED
BARBITURATES: NOT DETECTED
Benzodiazepines: NOT DETECTED
COCAINE: NOT DETECTED
Opiates: NOT DETECTED
TETRAHYDROCANNABINOL: NOT DETECTED

## 2014-12-04 LAB — HEPATIC FUNCTION PANEL
ALK PHOS: 120 U/L — AB (ref 39–117)
ALT: 44 U/L (ref 0–53)
AST: 91 U/L — AB (ref 0–37)
Albumin: 2.2 g/dL — ABNORMAL LOW (ref 3.5–5.2)
BILIRUBIN DIRECT: 0.5 mg/dL (ref 0.0–0.5)
BILIRUBIN INDIRECT: 0.7 mg/dL (ref 0.3–0.9)
BILIRUBIN TOTAL: 1.2 mg/dL (ref 0.3–1.2)
Total Protein: 8.1 g/dL (ref 6.0–8.3)

## 2014-12-04 LAB — TROPONIN I: Troponin I: 0.03 ng/mL (ref <0.031)

## 2014-12-04 LAB — MAGNESIUM: Magnesium: 1.8 mg/dL (ref 1.5–2.5)

## 2014-12-04 LAB — CK: Total CK: 40 U/L (ref 7–232)

## 2014-12-04 MED ORDER — SODIUM CHLORIDE 0.9 % IV SOLN
INTRAVENOUS | Status: DC
  Administered 2014-12-04: 17:00:00 via INTRAVENOUS

## 2014-12-04 MED ORDER — SODIUM CHLORIDE 0.9 % IV SOLN: INTRAVENOUS | Status: DC

## 2014-12-04 MED ORDER — ONDANSETRON HCL 4 MG PO TABS: 4.0000 mg | ORAL_TABLET | Freq: Four times a day (QID) | ORAL | Status: DC | PRN

## 2014-12-04 MED ORDER — ONDANSETRON HCL 4 MG/2ML IJ SOLN: 4.0000 mg | Freq: Four times a day (QID) | INTRAMUSCULAR | Status: DC | PRN

## 2014-12-04 MED ORDER — HEPARIN SODIUM (PORCINE) 5000 UNIT/ML IJ SOLN
5000.0000 [IU] | Freq: Three times a day (TID) | INTRAMUSCULAR | Status: DC
  Administered 2014-12-05 – 2014-12-09 (×13): 5000 [IU] via SUBCUTANEOUS
  Filled 2014-12-04 (×14): qty 1

## 2014-12-04 MED ORDER — VANCOMYCIN HCL IN DEXTROSE 750-5 MG/150ML-% IV SOLN
750.0000 mg | Freq: Two times a day (BID) | INTRAVENOUS | Status: DC
  Administered 2014-12-04 – 2014-12-05 (×3): 750 mg via INTRAVENOUS
  Filled 2014-12-04 (×5): qty 150

## 2014-12-04 MED ORDER — SODIUM CHLORIDE 0.9 % IV BOLUS (SEPSIS)
500.0000 mL | Freq: Once | INTRAVENOUS | Status: AC
Start: 2014-12-04 — End: 2014-12-04
  Administered 2014-12-04: 500 mL via INTRAVENOUS

## 2014-12-04 MED ORDER — ACETAMINOPHEN 650 MG RE SUPP: 650.0000 mg | Freq: Four times a day (QID) | RECTAL | Status: DC | PRN

## 2014-12-04 MED ORDER — OMEGA-3-ACID ETHYL ESTERS 1 G PO CAPS
1.0000 g | ORAL_CAPSULE | Freq: Every day | ORAL | Status: DC
  Administered 2014-12-05 – 2014-12-10 (×6): 1 g via ORAL
  Filled 2014-12-04 (×6): qty 1

## 2014-12-04 MED ORDER — OXYCODONE HCL 5 MG PO TABS
5.0000 mg | ORAL_TABLET | ORAL | Status: DC | PRN
  Administered 2014-12-05 – 2014-12-10 (×3): 5 mg via ORAL
  Filled 2014-12-04 (×3): qty 1

## 2014-12-04 MED ORDER — FISH OIL + D3 1200-1000 MG-UNIT PO CAPS: 1.0000 | ORAL_CAPSULE | Freq: Every day | ORAL | Status: DC

## 2014-12-04 MED ORDER — ALUM & MAG HYDROXIDE-SIMETH 200-200-20 MG/5ML PO SUSP: 30.0000 mL | Freq: Four times a day (QID) | ORAL | Status: DC | PRN

## 2014-12-04 MED ORDER — VITAMIN D 1000 UNITS PO TABS
1000.0000 [IU] | ORAL_TABLET | Freq: Every day | ORAL | Status: DC
  Administered 2014-12-05 – 2014-12-10 (×6): 1000 [IU] via ORAL
  Filled 2014-12-04 (×8): qty 1

## 2014-12-04 MED ORDER — ACETAMINOPHEN 325 MG PO TABS: 650.0000 mg | ORAL_TABLET | Freq: Four times a day (QID) | ORAL | Status: DC | PRN

## 2014-12-04 MED ORDER — HYDROMORPHONE HCL 1 MG/ML IJ SOLN: 0.5000 mg | INTRAMUSCULAR | Status: DC | PRN

## 2014-12-04 MED ORDER — SODIUM CHLORIDE 0.9 % IV BOLUS (SEPSIS)
500.0000 mL | Freq: Once | INTRAVENOUS | Status: AC
  Administered 2014-12-04: 500 mL via INTRAVENOUS

## 2014-12-04 MED ORDER — SODIUM CHLORIDE 0.9 % IV SOLN
INTRAVENOUS | Status: DC
  Administered 2014-12-05 – 2014-12-07 (×4): via INTRAVENOUS

## 2014-12-04 MED ORDER — SODIUM CHLORIDE 0.9 % IV BOLUS (SEPSIS): 1000.0000 mL | INTRAVENOUS | Status: DC

## 2014-12-04 MED ORDER — VANCOMYCIN HCL IN DEXTROSE 1-5 GM/200ML-% IV SOLN
1000.0000 mg | Freq: Once | INTRAVENOUS | Status: DC
  Filled 2014-12-04: qty 200

## 2014-12-04 MED ORDER — SODIUM CHLORIDE 0.9 % IJ SOLN
3.0000 mL | Freq: Two times a day (BID) | INTRAMUSCULAR | Status: DC
  Administered 2014-12-06: 3 mL via INTRAVENOUS

## 2014-12-04 MED ORDER — CEFEPIME HCL 2 G IJ SOLR
2.0000 g | Freq: Two times a day (BID) | INTRAMUSCULAR | Status: DC
  Administered 2014-12-05 (×3): 2 g via INTRAVENOUS
  Filled 2014-12-04 (×5): qty 2

## 2014-12-04 NOTE — H&P (Signed)
[facility] Admission History and Physical       Cameron Santos WJX:[id number] DOB: June 24, 1951 DOA: 12/04/2014  Referring physician: PCP: Cameron Santos  Specialists:   Chief Complaint: Fever Chills and Body aches.    HPI: Cameron Santos is a 64 y.o. male with a history of CVA, Hepatitis A, B,and C, w presented to the ED with complaints of myalgias and low grade fevers and chills x 3 days.  He was found to have tachycardia and orthostatic hypotension.  He was administered IVFs and had improvement in his tachycardia.  A sepsis work up was initiated, and he was placed on IV Vancomycin and Cefepime,and an Influenza panel was sent.  He was referred for medical admission.     Review of Systems:  Constitutional: No Weight Loss, No Weight Gain, Night Sweats, +Fevers, +Chills, +Myalgias,  Dizziness, Light Headedness, Fatigue, or Generalized Weakness HEENT: No Headaches, Difficulty Swallowing,Tooth/Dental Problems,Sore Throat,  No Sneezing, Rhinitis, Ear Ache, Nasal Congestion, or Post Nasal Drip,  Cardio-vascular:  No Chest pain, Orthopnea, PND, Edema in Lower Extremities, Anasarca, Dizziness, Palpitations  Resp: No Dyspnea, No DOE, No Productive Cough, No Non-Productive Cough, No Hemoptysis, No Wheezing.    GI: No Heartburn, Indigestion, Abdominal Pain, Nausea, Vomiting, Diarrhea, Constipation, Hematemesis, Hematochezia, Melena, Change in Bowel Habits,  Loss of Appetite  GU: No Dysuria, No Change in Color of Urine, No Urgency or Urinary Frequency, No Flank pain.  Musculoskeletal: No Joint Pain or Swelling, No Decreased Range of Motion, No Back Pain.  Neurologic: No Syncope, No Seizures, Muscle Weakness, Paresthesia, Vision Disturbance or Loss, No Diplopia, No Vertigo, No Difficulty Walking,  Skin: No Rash or Lesions. Psych: No Change in Mood or Affect, No Depression or Anxiety, No Memory loss, No Confusion, or Hallucinations   Past Medical History  Diagnosis Date  . Thyroid disease     . CVA (cerebral infarction)     2011  . Syncope and collapse     "here lately" (07/07/2013)  . Cirrhosis of liver     "one step up from cirrhosis" (07/07/2013)  . MI, old 2011  . Lower extremity numbness     "feet and legs go to sleep randomly" (07/07/2013)  . Chronic bronchitis     "spring q yr; comes w/the pollen" (07/07/2013)  . Stroke 2011    "weak on my right arm & leg since" (07/07/2013)  . Hepatitis C virus     "A, B, & C" (07/07/2013)  . Hepatitis A   . Hepatitis B   . Headache(784.0)     "1-2 days q week; from stress" (07/07/2013"  . Epilepsy     "epilepsy fits in my teens; grew out of them before I went into the [facility]" (07/07/2013)  . Depression   . Liver cancer     "been diagnosed w/moderate liver cancer" (07/07/2013)  . Traumatic closed displaced fracture of multiple ribs of left side     /notes 07/07/2013; "fell ~ 1 wk ago" (07/07/2013)  . Chronic back pain     with sciatica  . Malnutrition      Past Surgical History  Procedure Laterality Date  . Liver surgery  2011    "went in to kill 2 spots on my liver; killed one of them; never did kill the 2nd one" (07/07/2013)      Prior to Admission medications   Medication Sig Start Date End Date Taking? Authorizing Provider  aspirin 81 MG chewable tablet Chew 1 tablet (81 mg  total) by mouth daily. 10/04/14  Yes Carlisle Cater, PA-C  Fish Oil-Cholecalciferol (FISH OIL + D3 PO) Take 1,000 Units by mouth daily.   Yes Historical Provider, MD  Multiple Vitamin (MULTIVITAMIN WITH MINERALS) TABS Take 1 tablet by mouth daily.   Yes Historical Provider, MD  naproxen sodium (ANAPROX) 220 MG tablet Take 220 mg by mouth daily as needed (for pain).   Yes Historical Provider, MD  oxyCODONE (OXY IR/ROXICODONE) 5 MG immediate release tablet Take 1 tablet (5 mg total) by mouth every 4 (four) hours as needed for severe pain. 10/28/14  Yes Hollace Kinnier [place], PA-C  predniSONE (DELTASONE) 10 MG tablet 6,5,4,3,2,1 taper 10/28/14   Fransico Meadow, PA-C     Allergies  Allergen Reactions  . Penicillins Diarrhea and Rash    Has been 20 years since last known reaction    Social History:  reports that he has quit smoking. His smoking use included Cigarettes. He has a 25 pack-year smoking history. He has never used smokeless tobacco. He reports that he drinks about 3.6 oz of alcohol per week. He reports that he uses illicit drugs (Marijuana).    No family history on file.     Physical Exam:  GEN:  Pleasant Cachectic  64 y.o. African American male examined and in no acute distress; cooperative with exam Filed Vitals:   12/04/14 2000 12/04/14 2045 12/04/14 2130 12/04/14 2142  BP: 121/77 125/67 120/70 120/70  Pulse: 99 86 84 82  Temp:      TempSrc:      Resp: 17 21 19 20   Height:      Weight:      SpO2: 100% 100% 100% 100%   Blood pressure 120/70, pulse 82, temperature 99 F (37.2 C), temperature source Rectal, resp. rate 20, height 5\' 6"  (1.676 m), weight 54.432 kg (120 lb), SpO2 100 %. PSYCH: He is alert and oriented x4; does not appear anxious does not appear depressed; affect is normal HEENT: Normocephalic and Atraumatic, Mucous membranes pink; PERRLA; EOM intact; Fundi:  Benign;  No scleral icterus, Nares: Patent, Oropharynx: Clear, Fair Dentition,    Neck:  FROM, No Cervical Lymphadenopathy nor Thyromegaly or Carotid Bruit; No JVD; Breasts:: Not examined CHEST WALL: No tenderness CHEST: Normal respiration, clear to auscultation bilaterally HEART: Regular rate and rhythm; no murmurs rubs or gallops BACK: No kyphosis or scoliosis; No CVA tenderness ABDOMEN: Positive Bowel Sounds, Scaphoid,Soft Non-Tender, No Rebound or Guarding; No Masses, No Organomegaly Rectal Exam: Not done EXTREMITIES: No Cyanosis, Clubbing, or Edema; No Ulcerations. Genitalia: not examined PULSES: 2+ and symmetric SKIN: Normal hydration no rash or ulceration CNS:  Alert and Oriented x 4, No Focal Deficits Vascular: pulses palpable  throughout    Labs on Admission:  Basic Metabolic Panel:  Recent Labs Lab 12/04/14 1315 12/04/14 1630  NA 135  --   K 4.3  --   CL 100  --   CO2 25  --   GLUCOSE 115*  --   BUN 12  --   CREATININE 0.81  --   CALCIUM 9.8  --   MG  --  1.8   Liver Function Tests:  Recent Labs Lab 12/04/14 1630  AST 91*  ALT 44  ALKPHOS 120*  BILITOT 1.2  PROT 8.1  ALBUMIN 2.2*    Recent Labs Lab 12/04/14 1630  LIPASE 30   No results for input(s): AMMONIA in the last 168 hours. CBC:  Recent Labs Lab 12/04/14 1315  WBC 11.7*  NEUTROABS 8.8*  HGB 12.6*  HCT 37.2*  MCV 86.9  PLT 362   Cardiac Enzymes:  Recent Labs Lab 12/04/14 1630  CKTOTAL 40  TROPONINI 0.03    BNP (last 3 results) No results for input(s): BNP in the last 8760 hours.  ProBNP (last 3 results) No results for input(s): PROBNP in the last 8760 hours.  CBG: No results for input(s): GLUCAP in the last 168 hours.  Radiological Exams on Admission: Dg Chest 2 View  12/04/2014   CLINICAL DATA:  Generalized body aches  EXAM: CHEST  2 VIEW  COMPARISON:  07/07/2013  FINDINGS: Normal heart size and mediastinal contours.  Minimal scar or atelectasis posterior to the trachea and overlapping the lower heart in the lateral projection. No consolidation or edema. No effusion or pneumothorax. No acute osseous findings.  IMPRESSION: No active cardiopulmonary disease.   Electronically Signed   By: Monte Fantasia M.D.   On: 12/04/2014 18:04     EKG: Independently reviewed.         Assessment/Plan:   64 y.o. male with  Principal Problem:   1.    SIRS (systemic inflammatory response syndrome)   Sepsis Work Up initiated   [employer] and Urineien Cultures sent   IV Vancomycin and Cefepime    Droplet Precautions   Check Influenza Panel   IVFs   Active Problems:   2.   Tachycardia- improved with IV Fluid Resuscitation   IVFs     3.   Orthostatic hypotension- improved with IVFs   IVFs   Monitor Orthostatic  Vitals     4.   Malnutrition of moderate degree   Nutrition Consult     5.  Thyroid disease-   Check TSH    6.   DVT Prophylaxis   SQ Heparin    Code Status:     FULL CODE        Family Communication:   No Family Present    Disposition Plan:    Inpatient Status        Time spent:  [facility] Pager [phone]   If [place] Please Contact the Day Rounding Team MD for [facility]  If 7PM-7AM, Please Contact Night-Floor Coverage  [email or web address] Password [facility] 12/04/2014, 10:23 PM     ADDENDUM:   Patient was seen and examined on 12/04/2014

## 2014-12-04 NOTE — ED Notes (Addendum)
Pt ambulated in hallway with pulse Ox, denies SOB, stats remained at 99-100% on room air. Pulse jumped from 90 resting to 122 while ambulating.

## 2014-12-04 NOTE — ED Notes (Signed)
Pt left for Xray.

## 2014-12-04 NOTE — ED Provider Notes (Signed)
CSN: [id number]     Arrival date & time 12/04/14  1248 History   First MD Initiated Contact with Patient 12/04/14 1607     Chief Complaint  Patient presents with  . Generalized Body Aches      HPI Pt was seen at 1610. Per pt, c/o gradual onset and persistence of constant "aching all over" for the past 3 to 4 days. Pt states today he "ate some spoiled soup" and "it gave me heartburn." Denies any other complaints. Denies N/V/D, no abd pain, no CP/SOB, no back pain, no fevers.    Past Medical History  Diagnosis Date  . Thyroid disease   . CVA (cerebral infarction)     2011  . Syncope and collapse     "here lately" (07/07/2013)  . Cirrhosis of liver     "one step up from cirrhosis" (07/07/2013)  . MI, old 2011  . Lower extremity numbness     "feet and legs go to sleep randomly" (07/07/2013)  . Chronic bronchitis     "spring q yr; comes w/the pollen" (07/07/2013)  . Stroke 2011    "weak on my right arm & leg since" (07/07/2013)  . Hepatitis C virus     "A, B, & C" (07/07/2013)  . Hepatitis A   . Hepatitis B   . Headache(784.0)     "1-2 days q week; from stress" (07/07/2013"  . Epilepsy     "epilepsy fits in my teens; grew out of them before I went into the [facility]" (07/07/2013)  . Depression   . Liver cancer     "been diagnosed w/moderate liver cancer" (07/07/2013)  . Traumatic closed displaced fracture of multiple ribs of left side     /notes 07/07/2013; "fell ~ 1 wk ago" (07/07/2013)  . Chronic back pain     with sciatica  . Malnutrition    Past Surgical History  Procedure Laterality Date  . Liver surgery  2011    "went in to kill 2 spots on my liver; killed one of them; never did kill the 2nd one" (07/07/2013)    History  Substance Use Topics  . Smoking status: Former Smoker -- 1.00 packs/day for 25 years    Types: Cigarettes  . Smokeless tobacco: Never Used     Comment: 07/07/2013 "quit smoking cigarettes ~ 20 yr ago"  . Alcohol Use: 3.6 oz/week    6 Cans of  beer per week     Comment: 10/24 "drink ~ 6 pack of beer/wk now; 12 pack of beer/night for 3 years while in the [place]" (07/07/2013)    Review of Systems ROS: Statement: All systems negative except as marked or noted in the HPI; Constitutional: Negative for fever and chills. +"ache all over." ; ; Eyes: Negative for eye pain, redness and discharge. ; ; ENMT: Negative for ear pain, hoarseness, nasal congestion, sinus pressure and sore throat. ; ; Cardiovascular: Negative for chest pain, palpitations, diaphoresis, dyspnea and peripheral edema. ; ; Respiratory: Negative for cough, wheezing and stridor. ; ; Gastrointestinal: +"heartburn." Negative for nausea, vomiting, diarrhea, abdominal pain, blood in stool, hematemesis, jaundice and rectal bleeding. . ; ; Genitourinary: Negative for dysuria, flank pain and hematuria. ; ; Musculoskeletal: Negative for back pain and neck pain. Negative for swelling and trauma.; ; Skin: Negative for pruritus, rash, abrasions, blisters, bruising and skin lesion.; ; Neuro: Negative for headache, lightheadedness and neck stiffness. Negative for weakness, altered level of consciousness , altered mental status, extremity weakness, paresthesias, involuntary movement,  seizure and syncope.      Allergies  Penicillins  Home Medications   Prior to Admission medications   Medication Sig Start Date End Date Taking? Authorizing Provider  aspirin 81 MG chewable tablet Chew 1 tablet (81 mg total) by mouth daily. 10/04/14  Yes Carlisle Cater, PA-C  Fish Oil-Cholecalciferol (FISH OIL + D3 PO) Take 1,000 Units by mouth daily.   Yes Historical Provider, MD  Multiple Vitamin (MULTIVITAMIN WITH MINERALS) TABS Take 1 tablet by mouth daily.   Yes Historical Provider, MD  naproxen sodium (ANAPROX) 220 MG tablet Take 220 mg by mouth daily as needed (for pain).   Yes Historical Provider, MD  oxyCODONE (OXY IR/ROXICODONE) 5 MG immediate release tablet Take 1 tablet (5 mg total) by mouth every 4  (four) hours as needed for severe pain. 10/28/14  Yes Hollace Kinnier Sofia, PA-C  predniSONE (DELTASONE) 10 MG tablet 6,5,4,3,2,1 taper 10/28/14   Hollace Kinnier Sofia, PA-C   BP 121/77 mmHg  Pulse 119  Temp(Src) 99 F (37.2 C) (Rectal)  Resp 17  Ht 5\' 6"  (1.676 m)  Wt 120 lb (54.432 kg)  BMI 19.38 kg/m2  SpO2 100%   16:15:42 Orthostatic Vital Signs AM  Orthostatic Lying  - BP- Lying: 106/76 mmHg ; Pulse- Lying: 120  Orthostatic Sitting - BP- Sitting: 110/70 mmHg ; Pulse- Sitting: 130  Orthostatic Standing at 0 minutes - BP- Standing at 0 minutes: 102/57 mmHg ; Pulse- Standing at 0 minutes: 128       Physical Exam  1615: Physical examination:  Nursing notes reviewed; Vital signs and O2 SAT reviewed;  Constitutional: Cachectic. In no acute distress; Head:  Normocephalic, atraumatic; Eyes: EOMI, PERRL, No scleral icterus; ENMT: Mouth and pharynx normal, Mucous membranes dry and cracked; Neck: Supple, Full range of motion, No lymphadenopathy; Cardiovascular: Tachycardic rate and rhythm, No gallop; Respiratory: Breath sounds clear & equal bilaterally, No wheezes.  Speaking full sentences with ease, Normal respiratory effort/excursion; Chest: Nontender, Movement normal; Abdomen: Soft, Nontender, Nondistended, Normal bowel sounds; Genitourinary: No CVA tenderness; Extremities: Pulses normal, No tenderness, No edema, No calf edema or asymmetry.; Neuro: AA&Ox3, vague/rambling historian. Major CN grossly intact.  Speech clear. Moves all extremities on stretcher spontaneously and to command..; Skin: Color normal, Warm, Dry.   ED Course  Procedures     EKG Interpretation   Date/Time:  Tuesday December 04 2014 13:03:37 EDT Ventricular Rate:  132 PR Interval:  126 QRS Duration: 70 QT Interval:  298 QTC Calculation: 441 R Axis:   73 Text Interpretation:  Sinus tachycardia Baseline wander When compared with  ECG of 10/04/2014 Rate faster Confirmed by [facility]  MD, Nunzio Cory [phone])  on 12/04/2014 4:59:42 PM       MDM  MDM Reviewed: previous chart, nursing note and vitals Reviewed previous: labs and ECG Interpretation: labs, ECG and x-ray     Results for orders placed or performed during the hospital encounter of 12/04/14  CBC with Differential  Result Value Ref Range   WBC 11.7 (H) 4.0 - 10.5 K/uL   RBC 4.28 4.22 - 5.81 MIL/uL   Hemoglobin 12.6 (L) 13.0 - 17.0 g/dL   HCT 37.2 (L) 39.0 - 52.0 %   MCV 86.9 78.0 - 100.0 fL   MCH 29.4 26.0 - 34.0 pg   MCHC 33.9 30.0 - 36.0 g/dL   RDW 12.8 11.5 - 15.5 %   Platelets 362 150 - 400 K/uL   Neutrophils Relative % 76 43 - 77 %   Neutro Abs  8.8 (H) 1.7 - 7.7 K/uL   Lymphocytes Relative 15 12 - 46 %   Lymphs Abs 1.7 0.7 - 4.0 K/uL   Monocytes Relative 9 3 - 12 %   Monocytes Absolute 1.1 (H) 0.1 - 1.0 K/uL   Eosinophils Relative 0 0 - 5 %   Eosinophils Absolute 0.0 0.0 - 0.7 K/uL   Basophils Relative 0 0 - 1 %   Basophils Absolute 0.0 0.0 - 0.1 K/uL  Basic metabolic panel  Result Value Ref Range   Sodium 135 135 - 145 mmol/L   Potassium 4.3 3.5 - 5.1 mmol/L   Chloride 100 96 - 112 mmol/L   CO2 25 19 - 32 mmol/L   Glucose, Bld 115 (H) 70 - 99 mg/dL   BUN 12 6 - 23 mg/dL   Creatinine, Ser 0.81 0.50 - 1.35 mg/dL   Calcium 9.8 8.4 - 10.5 mg/dL   GFR calc non Af Amer >90 >90 mL/min   GFR calc Af Amer >90 >90 mL/min   Anion gap 10 5 - 15  Hepatic function panel  Result Value Ref Range   Total Protein 8.1 6.0 - 8.3 g/dL   Albumin 2.2 (L) 3.5 - 5.2 g/dL   AST 91 (H) 0 - 37 U/L   ALT 44 0 - 53 U/L   Alkaline Phosphatase 120 (H) 39 - 117 U/L   Total Bilirubin 1.2 0.3 - 1.2 mg/dL   Bilirubin, Direct 0.5 0.0 - 0.5 mg/dL   Indirect Bilirubin 0.7 0.3 - 0.9 mg/dL  Lipase, blood  Result Value Ref Range   Lipase 30 11 - 59 U/L  Urinalysis, Routine w reflex microscopic  Result Value Ref Range   Color, Urine AMBER (A) YELLOW   APPearance CLEAR CLEAR   Specific Gravity, Urine 1.021 1.005 - 1.030   pH 6.0 5.0 - 8.0   Glucose, UA NEGATIVE  NEGATIVE mg/dL   Hgb urine dipstick MODERATE (A) NEGATIVE   Bilirubin Urine SMALL (A) NEGATIVE   Ketones, ur 15 (A) NEGATIVE mg/dL   Protein, ur NEGATIVE NEGATIVE mg/dL   Urobilinogen, UA 4.0 (H) 0.0 - 1.0 mg/dL   Nitrite NEGATIVE NEGATIVE   Leukocytes, UA NEGATIVE NEGATIVE  Ethanol  Result Value Ref Range   Alcohol, Ethyl (B) <5 0 - 9 mg/dL  CK  Result Value Ref Range   Total CK 40 7 - 232 U/L  Protime-INR  Result Value Ref Range   Prothrombin Time 16.8 (H) 11.6 - 15.2 seconds   INR 1.35 0.00 - 1.49  Troponin I  Result Value Ref Range   Troponin I 0.03 <0.031 ng/mL  Urine microscopic-add on  Result Value Ref Range   Squamous Epithelial / LPF FEW (A) RARE   WBC, UA 0-2 <3 WBC/hpf   RBC / HPF 7-10 <3 RBC/hpf   Bacteria, UA RARE RARE   Casts HYALINE CASTS (A) NEGATIVE   Urine-Other MUCOUS PRESENT    Dg Chest 2 View 12/04/2014   CLINICAL DATA:  Generalized body aches  EXAM: CHEST  2 VIEW  COMPARISON:  07/07/2013  FINDINGS: Normal heart size and mediastinal contours.  Minimal scar or atelectasis posterior to the trachea and overlapping the lower heart in the lateral projection. No consolidation or edema. No effusion or pneumothorax. No acute osseous findings.  IMPRESSION: No active cardiopulmonary disease.   Electronically Signed   By: Monte Fantasia M.D.   On: 12/04/2014 18:04    2140:   Hypotension improving after IVF. HR at rest now  80, down from 140. Pt ambulated with HR increasing to 120's.  Will need further IVF. Dx and testing d/w pt.  Questions answered.  Verb understanding, agreeable to admit. T/C to [facility]. Arnoldo Morale, case discussed, including:  HPI, pertinent PM/SHx, VS/PE, dx testing, ED course and treatment:  Agreeable to admit, requests to write temporary orders, obtain stepdown bed to team [facility].    Francine Graven, DO 12/05/14 1456

## 2014-12-04 NOTE — Progress Notes (Signed)
ANTIBIOTIC CONSULT NOTE - INITIAL  Pharmacy Consult for Vancomycin and Cefepime Indication: sepsis  Allergies  Allergen Reactions  . Penicillins Diarrhea and Rash    Has been 20 years since last known reaction    Patient Measurements: Height: 5\' 6"  (167.6 cm) Weight: 120 lb (54.432 kg) IBW/kg (Calculated) : 63.8  Vital Signs: Temp: 99 F (37.2 C) (03/22 1613) Temp Source: Rectal (03/22 1613) BP: 120/70 mmHg (03/22 2142) Pulse Rate: 82 (03/22 2142) Intake/Output from previous day:   Intake/Output from this shift:    Labs:  Recent Labs  12/04/14 1315  WBC 11.7*  HGB 12.6*  PLT 362  CREATININE 0.81   Estimated Creatinine Clearance: 70.9 mL/min (by C-G formula based on Cr of 0.81). No results for input(s): VANCOTROUGH, VANCOPEAK, VANCORANDOM, GENTTROUGH, GENTPEAK, GENTRANDOM, TOBRATROUGH, TOBRAPEAK, TOBRARND, AMIKACINPEAK, AMIKACINTROU, AMIKACIN in the last 72 hours.   Microbiology: No results found for this or any previous visit (from the past 720 hour(s)).  Medical History: Past Medical History  Diagnosis Date  . Thyroid disease   . CVA (cerebral infarction)     2011  . Syncope and collapse     "here lately" (07/07/2013)  . Cirrhosis of liver     "one step up from cirrhosis" (07/07/2013)  . MI, old 2011  . Lower extremity numbness     "feet and legs go to sleep randomly" (07/07/2013)  . Chronic bronchitis     "spring q yr; comes w/the pollen" (07/07/2013)  . Stroke 2011    "weak on my right arm & leg since" (07/07/2013)  . Hepatitis C virus     "A, B, & C" (07/07/2013)  . Hepatitis A   . Hepatitis B   . Headache(784.0)     "1-2 days q week; from stress" (07/07/2013"  . Epilepsy     "epilepsy fits in my teens; grew out of them before I went into the [place]" (07/07/2013)  . Depression   . Liver cancer     "been diagnosed w/moderate liver cancer" (07/07/2013)  . Traumatic closed displaced fracture of multiple ribs of left side     /notes  07/07/2013; "fell ~ 1 wk ago" (07/07/2013)  . Chronic back pain     with sciatica  . Malnutrition     Medications:  Prescriptions prior to admission  Medication Sig Dispense Refill Last Dose  . aspirin 81 MG chewable tablet Chew 1 tablet (81 mg total) by mouth daily. 30 tablet 0 12/04/2014 at Unknown time  . Fish Oil-Cholecalciferol (FISH OIL + D3 PO) Take 1,000 Units by mouth daily.   12/04/2014 at Unknown time  . Multiple Vitamin (MULTIVITAMIN WITH MINERALS) TABS Take 1 tablet by mouth daily.   12/04/2014 at Unknown time  . naproxen sodium (ANAPROX) 220 MG tablet Take 220 mg by mouth daily as needed (for pain).   Past Week at Unknown time  . oxyCODONE (OXY IR/ROXICODONE) 5 MG immediate release tablet Take 1 tablet (5 mg total) by mouth every 4 (four) hours as needed for severe pain. 10 tablet 0 12/04/2014 at Unknown time  . predniSONE (DELTASONE) 10 MG tablet 6,5,4,3,2,1 taper 21 tablet 0    Assessment: 64 y.o. male presents with generalized body aches. Initially BP low, HR tachy, WBC elevated so treating with broad spectum abx (Vancomycin and Cefepime) for r/o sepsis. SCr 0.81, est CrCl 70 ml/min. Afebrile.  Goal of Therapy:  Vancomycin trough level 15-20 mcg/ml  Plan:  Continue Cefepime 2gm IV q12 Vancomycin 750mg  IV q12h F/u  renal function, micro data, pt's clinical condition  Sherlon Handing, PharmD, BCPS Clinical pharmacist, pager [phone] 12/04/2014,10:37 PM

## 2014-12-04 NOTE — ED Notes (Signed)
Pt reports joint pain, aching all over. Reports today he ate some spoiled soup and it gave him heartburn.

## 2014-12-04 NOTE — ED Notes (Signed)
No urine, from the in and out cath

## 2014-12-05 ENCOUNTER — Encounter (HOSPITAL_COMMUNITY): Payer: Self-pay | Admitting: General Practice

## 2014-12-05 LAB — MRSA PCR SCREENING: MRSA by PCR: NEGATIVE

## 2014-12-05 LAB — BASIC METABOLIC PANEL
ANION GAP: 4 — AB (ref 5–15)
BUN: 11 mg/dL (ref 6–23)
CO2: 24 mmol/L (ref 19–32)
Calcium: 8.5 mg/dL (ref 8.4–10.5)
Chloride: 106 mmol/L (ref 96–112)
Creatinine, Ser: 0.66 mg/dL (ref 0.50–1.35)
GFR calc Af Amer: >90 mL/min (ref >90)
Glucose, Bld: 86 mg/dL (ref 70–99)
POTASSIUM: 4 mmol/L (ref 3.5–5.1)
SODIUM: 134 mmol/L — AB (ref 135–145)

## 2014-12-05 LAB — COMPREHENSIVE METABOLIC PANEL
ALBUMIN: 2 g/dL — AB (ref 3.5–5.2)
ALK PHOS: 105 U/L (ref 39–117)
ALT: 36 U/L (ref 0–53)
ANION GAP: 6 (ref 5–15)
AST: 66 U/L — ABNORMAL HIGH (ref 0–37)
BUN: 11 mg/dL (ref 6–23)
CO2: 22 mmol/L (ref 19–32)
CREATININE: 0.76 mg/dL (ref 0.50–1.35)
Calcium: 8.8 mg/dL (ref 8.4–10.5)
Chloride: 106 mmol/L (ref 96–112)
GFR calc non Af Amer: >90 mL/min (ref >90)
Glucose, Bld: 115 mg/dL — ABNORMAL HIGH (ref 70–99)
Potassium: 4 mmol/L (ref 3.5–5.1)
SODIUM: 134 mmol/L — AB (ref 135–145)
TOTAL PROTEIN: 7 g/dL (ref 6.0–8.3)
Total Bilirubin: 1.4 mg/dL — ABNORMAL HIGH (ref 0.3–1.2)

## 2014-12-05 LAB — CBC
HCT: 28.8 % — ABNORMAL LOW (ref 39.0–52.0)
HEMATOCRIT: 33.2 % — AB (ref 39.0–52.0)
Hemoglobin: 11.2 g/dL — ABNORMAL LOW (ref 13.0–17.0)
Hemoglobin: 9.8 g/dL — ABNORMAL LOW (ref 13.0–17.0)
MCH: 29.1 pg (ref 26.0–34.0)
MCH: 29.3 pg (ref 26.0–34.0)
MCHC: 33.7 g/dL (ref 30.0–36.0)
MCHC: 34 g/dL (ref 30.0–36.0)
MCV: 85.5 fL (ref 78.0–100.0)
MCV: 86.9 fL (ref 78.0–100.0)
PLATELETS: 231 10*3/uL (ref 150–400)
Platelets: 265 10*3/uL (ref 150–400)
RBC: 3.37 MIL/uL — ABNORMAL LOW (ref 4.22–5.81)
RBC: 3.82 MIL/uL — ABNORMAL LOW (ref 4.22–5.81)
RDW: 12.8 % (ref 11.5–15.5)
RDW: 12.7 % (ref 11.5–15.5)
WBC: 9.6 10*3/uL (ref 4.0–10.5)
WBC: 8.9 10*3/uL (ref 4.0–10.5)

## 2014-12-05 LAB — CORTISOL: Cortisol, Plasma: 21.1 ug/dL

## 2014-12-05 LAB — LACTIC ACID, PLASMA
Lactic Acid, Venous: 1.7 mmol/L (ref 0.5–2.0)
Lactic Acid, Venous: 1.2 mmol/L (ref 0.5–2.0)
Lactic Acid, Venous: 1.2 mmol/L (ref 0.5–2.0)

## 2014-12-05 LAB — INFLUENZA PANEL BY PCR (TYPE A & B)
H1N1FLUPCR: NOT DETECTED
INFLAPCR: NEGATIVE
Influenza B By PCR: NEGATIVE

## 2014-12-05 NOTE — Progress Notes (Signed)
Consult placed for Mr. Cameron Santos that occasioned Chaplain visit.   Mr. Cameron Santos expressed that he is "feeling better" but his stroke caught him by surprise.   He noted that he bought a cane and would still fall down because of he was feeling "weak and wobbly".   He further noted that his eyesight is "getting dim," he said his doctor checked for cataracts and glaucoma with the results being negative. He expressed concern about his sight because although the results are negative he notes significant changes.   Mr. Cameron Santos has familial support from his wife of 30+ years and a son who is active duty Nature conservation officer in [employer] as a Clinical biochemist.   Will follow-up later.   Delford Field, Chaplain 12/05/2014

## 2014-12-05 NOTE — Progress Notes (Signed)
[facility] PROGRESS NOTE  Cameron Santos WUJ:[id number] DOB: 1950-09-27 DOA: 12/04/2014 PCP: [facility]  Assessment/Plan: 1. SIRS- patient presented with systemic inflammatory response syndrome, started empirically on vancomycin and cefepime. Blood cultures and urine cultures have been sent influenza panel PCR is negative. 2. Orthostatic hypotension- blood pressure improved with IV fluids 3. Malnutrition- will consult dietitian for nutritional assessment. We will also obtain PT OT consultation 4. DVT prophylaxis- heparin  Code Status: Full code Family Communication: *No family at bedside Disposition Plan: Home when stable   Consultants:  None  Procedures:  None  Antibiotics:  Vancomycin  Cefepime  HPI/Subjective: 64 year old male with a history of CVA, hepatitis A,B, C who came with fever chills and body aches. Patient was found to be tachycardic and hypotensive. Patient was started on vancomycin and cefepime empirically, sepsis workup initiated. Influenza PCR panel sent.  This morning patient feels better, influenza PCR is negative   Objective: Filed Vitals:   12/05/14 1239  BP: 119/68  Pulse: 89  Temp: 98.7 F (37.1 C)  Resp: 18    Intake/Output Summary (Last 24 hours) at 12/05/14 1359 Last data filed at 12/05/14 1153  Gross per 24 hour  Intake    480 ml  Output    400 ml  Net     80 ml   Filed Weights   12/04/14 1306 12/04/14 2300 12/05/14 0447  Weight: 54.432 kg (120 lb) 45.224 kg (99 lb 11.2 oz) 44.951 kg (99 lb 1.6 oz)    Exam:   General:  Appears in no acute distress  Cardiovascular: S1-S2 regular  Respiratory: Clear bilaterally  Abdomen: Soft, nontender no organomegaly  Musculoskeletal: No edema of the lower extremities noted.  Data Reviewed: Basic Metabolic Panel:  Recent Labs Lab 12/04/14 1315 12/04/14 1630 12/05/14 0005 12/05/14 0458  NA 135  --  134* 134*  K 4.3  --  4.0 4.0  CL 100  --  106 106  CO2 25  --   22 24  GLUCOSE 115*  --  115* 86  BUN 12  --  11 11  CREATININE 0.81  --  0.76 0.66  CALCIUM 9.8  --  8.8 8.5  MG  --  1.8  --   --    Liver Function Tests:  Recent Labs Lab 12/04/14 1630 12/05/14 0005  AST 91* 66*  ALT 44 36  ALKPHOS 120* 105  BILITOT 1.2 1.4*  PROT 8.1 7.0  ALBUMIN 2.2* 2.0*    Recent Labs Lab 12/04/14 1630  LIPASE 30   No results for input(s): AMMONIA in the last 168 hours. CBC:  Recent Labs Lab 12/04/14 1315 12/05/14 0005 12/05/14 0458  WBC 11.7* 9.6 8.9  NEUTROABS 8.8*  --   --   HGB 12.6* 11.2* 9.8*  HCT 37.2* 33.2* 28.8*  MCV 86.9 86.9 85.5  PLT 362 265 231   Cardiac Enzymes:  Recent Labs Lab 12/04/14 1630  CKTOTAL 40  TROPONINI 0.03   BNP (last 3 results) No results for input(s): BNP in the last 8760 hours.  ProBNP (last 3 results) No results for input(s): PROBNP in the last 8760 hours.  CBG: No results for input(s): GLUCAP in the last 168 hours.  Recent Results (from the past 240 hour(s))  MRSA PCR Screening     Status: None   Collection Time: 12/04/14 11:01 PM  Result Value Ref Range Status   MRSA by PCR NEGATIVE NEGATIVE Final    Comment:  The GeneXpert MRSA Assay (FDA approved for NASAL specimens only), is one component of a comprehensive MRSA colonization surveillance program. It is not intended to diagnose MRSA infection nor to guide or monitor treatment for MRSA infections.      Studies: Dg Chest 2 View  12/04/2014   CLINICAL DATA:  Generalized body aches  EXAM: CHEST  2 VIEW  COMPARISON:  07/07/2013  FINDINGS: Normal heart size and mediastinal contours.  Minimal scar or atelectasis posterior to the trachea and overlapping the lower heart in the lateral projection. No consolidation or edema. No effusion or pneumothorax. No acute osseous findings.  IMPRESSION: No active cardiopulmonary disease.   Electronically Signed   By: Monte Fantasia M.D.   On: 12/04/2014 18:04    Scheduled Meds: . ceFEPime  (MAXIPIME) IV  2 g Intravenous Q12H  . omega-3 acid ethyl esters  1 g Oral Daily   And  . cholecalciferol  1,000 Units Oral Daily  . heparin  5,000 Units Subcutaneous 3 times per day  . sodium chloride  3 mL Intravenous Q12H  . vancomycin  750 mg Intravenous Q12H   Continuous Infusions: . sodium chloride 100 mL/hr at 12/05/14 0608    Principal Problem:   SIRS (systemic inflammatory response syndrome) Active Problems:   Malnutrition of moderate degree   Tachycardia   Orthostatic hypotension   Thyroid disease   Dehydration    Time spent: 30 minutes    [place] [facility] Pager [phone]. If 7PM-7AM, please contact night-coverage at [email or web address], password [facility] 12/05/2014, 1:59 PM  LOS: 1 day

## 2014-12-06 DIAGNOSIS — E43 Unspecified severe protein-calorie malnutrition: Secondary | ICD-10-CM | POA: Insufficient documentation

## 2014-12-06 LAB — URINE CULTURE
Colony Count: NO GROWTH
Culture: NO GROWTH

## 2014-12-06 LAB — CBC
HCT: 30.5 % — ABNORMAL LOW (ref 39.0–52.0)
Hemoglobin: 10.3 g/dL — ABNORMAL LOW (ref 13.0–17.0)
MCH: 28.9 pg (ref 26.0–34.0)
MCHC: 33.8 g/dL (ref 30.0–36.0)
MCV: 85.4 fL (ref 78.0–100.0)
PLATELETS: 231 10*3/uL (ref 150–400)
RBC: 3.57 MIL/uL — ABNORMAL LOW (ref 4.22–5.81)
RDW: 12.5 % (ref 11.5–15.5)
WBC: 8.1 10*3/uL (ref 4.0–10.5)

## 2014-12-06 LAB — BASIC METABOLIC PANEL
ANION GAP: 4 — AB (ref 5–15)
BUN: 6 mg/dL (ref 6–23)
CO2: 24 mmol/L (ref 19–32)
CREATININE: 0.61 mg/dL (ref 0.50–1.35)
Calcium: 8.5 mg/dL (ref 8.4–10.5)
Chloride: 106 mmol/L (ref 96–112)
GFR calc Af Amer: >90 mL/min (ref >90)
GFR calc non Af Amer: >90 mL/min (ref >90)
Glucose, Bld: 95 mg/dL (ref 70–99)
Potassium: 3.3 mmol/L — ABNORMAL LOW (ref 3.5–5.1)
Sodium: 134 mmol/L — ABNORMAL LOW (ref 135–145)

## 2014-12-06 MED ORDER — POTASSIUM CHLORIDE CRYS ER 20 MEQ PO TBCR
40.0000 meq | EXTENDED_RELEASE_TABLET | Freq: Once | ORAL | Status: AC
  Administered 2014-12-06: 40 meq via ORAL
  Filled 2014-12-06: qty 2

## 2014-12-06 MED ORDER — ENSURE PUDDING PO PUDG
1.0000 | Freq: Three times a day (TID) | ORAL | Status: DC
  Administered 2014-12-06 – 2014-12-10 (×12): 1 via ORAL

## 2014-12-06 NOTE — Clinical Social Work Psychosocial (Signed)
     Clinical Social Work Department BRIEF PSYCHOSOCIAL ASSESSMENT 12/06/2014  Patient:  Cameron Santos, Cameron Santos     Account Number:  [id number]     Admit date:  12/04/2014  Clinical Social Worker:  Adair Laundry  Date/Time:  12/06/2014 03:31 PM  Referred by:  Physician  Date Referred:  12/06/2014 Referred for  SNF Placement  SNF Placement   Other Referral:   Interview type:  Patient Other interview type:    PSYCHOSOCIAL DATA Living Status:  WIFE Admitted from facility:   Level of care:   Primary support name:  Ezequiel Kayser Primary support relationship to patient:  SPOUSE Degree of support available:   Pt has strong family support    CURRENT CONCERNS Current Concerns  Post-Acute Placement   Other Concerns:    SOCIAL WORK ASSESSMENT / PLAN CSW notified by RN that pt will require SNF at dc. CSW visited pt room to discuss [place] rehab. Pt informed CSW that prior to admission he was having some difficulty with his left leg but he expressed that he is doing worse as far as mobility right now. Pt confirmed that he has had a few falls in the last few weeks. Pt lives at home with his wife and felt he was able manage before but not doing as well lately. CSW explained [facility] SNF process. Pt aware that he may be denied and if he is approved only location SNF option is [facility]. Pt expressed understanding of this information and agreeable for CSW to start process and send information to the [place] and [facility]. Pt demonstrated a limited knowledge of his condition and was only able to tell CSW basic information about baseline condition and hospital admission. Pt is aware of potential for dc tomorrow.   Assessment/plan status:  Psychosocial Support/Ongoing Assessment of Needs Other assessment/ plan:   Information/referral to community resources:   [facility]    PATIENTS/FAMILYS RESPONSE TO PLAN OF CARE: Pt pleasant and cooperative. Pt is agreeable to dc [facility].       [place], [place]

## 2014-12-06 NOTE — Clinical Social Work Placement (Signed)
     Clinical Social Work Department CLINICAL SOCIAL WORK PLACEMENT NOTE 12/06/2014  Patient:  Cameron Santos, Cameron Santos  Account Number:  [id number] [place] date:  12/04/2014  Clinical Social Worker:  Berton Mount, Latanya Presser  Date/time:  12/06/2014 03:37 PM  Clinical Social Work is seeking post-discharge placement for this patient at the following level of care:   SKILLED NURSING   (*CSW will update this form in Epic as items are completed)   12/06/2014  Patient/family provided with [place] Department of Clinical Social Works list of facilities offering this level of care within the geographic area requested by the patient (or if unable, by the patients family).  12/06/2014  Patient/family informed of their freedom to choose among providers that offer the needed level of care, that participate in Medicare, Medicaid or managed care program needed by the patient, have an available bed and are willing to accept the patient.  12/06/2014  Patient/family informed of [facility] ownership interest in [facility], as well as of the fact that they are under no obligation to receive care at this facility.  PASARR submitted to EDS on 12/06/2014 PASARR number received on 12/06/2014  FL2 transmitted to all facilities in geographic area requested by pt/family on  12/06/2014 FL2 transmitted to all facilities within larger geographic area on   Patient informed that his/her managed care company has contracts with or will negotiate with  certain facilities, including the following:     Patient/family informed of bed offers received:   Patient chooses bed at  Physician recommends and patient chooses bed at    Patient to be transferred to  on   Patient to be transferred to facility by  Patient and family notified of transfer on  Name of family member notified:    The following physician request were entered in Epic: Physician Request  Please sign FL2.    Additional Comments: SNF referral form  faxed to [facility] screening committee on 12/06/2014   Berton Mount, [place]

## 2014-12-06 NOTE — Evaluation (Signed)
Physical Therapy Evaluation Patient Details Name: Cameron Santos MRN: [id number] DOB: 1950/10/18 Today's Date: 12/06/2014   History of Present Illness  Pt is a 64 y/o male admitted with tachycardia and orthostatic hypotension. He was administered IVFs and had improvement in his tachycardia. A sepsis work up was initiated and pt was admitted for further evaluation. 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   Clinical Impression  Pt admitted with above diagnosis. Pt currently with functional limitations due to the deficits listed below (see PT Problem List). At the time of PT eval pt was able to perform transfers and ambulation with min to mod assist. Pt reports a significant amount of falls in the past 2 weeks PTA. Pain and weakness on the LLE acutely is limiting pt on top of the residual weakness on the R side from the previous stroke. Pt will benefit from skilled PT to increase their independence and safety with mobility to allow discharge to the venue listed below. Discussed the option of rehab at d/c and pt is agreeable to SNF.     Follow Up Recommendations SNF;Supervision/Assistance - 24 hour    Equipment Recommendations  Rolling walker with 5" wheels    Recommendations for Other Services       Precautions / Restrictions Precautions Precautions: Fall Precaution Comments: Pt with >10 falls in the past 2 weeks.  Restrictions Weight Bearing Restrictions: No      Mobility  Bed Mobility Overal bed mobility: Needs Assistance Bed Mobility: Rolling;Sidelying to Sit Rolling: Min assist Sidelying to sit: Mod assist       General bed mobility comments: Pt was cued for hand placement on bed rails for support, and to roll to sidelying prior to sitting up. Assist to achieve full roll with assist for initiation of movement on the LLE. Pt required mod assist for elevation of trunk to full sitting position.   Transfers Overall transfer level: Needs  assistance Equipment used: Rolling walker (2 wheeled) Transfers: Sit to/from Stand Sit to Stand: Supervision         General transfer comment: Pt was able to power-up to full standing without physical assist. Increased time to initiate, and pt was cued for hand placement on seated surface for safety.   Ambulation/Gait Ambulation/Gait assistance: Min assist Ambulation Distance (Feet): 25 Feet Assistive device: Rolling walker (2 wheeled) Gait Pattern/deviations: Step-to pattern;Decreased stride length;Trunk flexed Gait velocity: Decreased Gait velocity interpretation: Below normal speed for age/gender General Gait Details: Pt was able to perform ambulation to hall and back to chair. Pt was dragging LLE along, appearing to have difficulty with hip flexion mostly.   Stairs            Wheelchair Mobility    Modified Rankin (Stroke Patients Only)       Balance Overall balance assessment: Needs assistance Sitting-balance support: Feet supported Sitting balance-Leahy Scale: Fair     Standing balance support: Bilateral upper extremity supported Standing balance-Leahy Scale: Poor Standing balance comment: Requires UE support at this time.                              Pertinent Vitals/Pain Pain Assessment: 0-10 Pain Score: 9  Pain Location: Left hip    Home Living Family/patient expects to be discharged to:: Private residence Living Arrangements: Spouse/significant other Available Help at Discharge: Family;Available 24 hours/day Type of Home: House Home Access: Stairs to enter  Entrance Stairs-Number of Steps: 3 Home Layout: One level Home Equipment: Cane - single point;Shower seat - built in;Grab bars - tub/shower      Prior Function Level of Independence: Independent with assistive device(s)         Comments: Has had falls when using the cane (12-13 falls in the past 2 weeks). Wife assists with ADL's.      Hand Dominance         Extremity/Trunk Assessment   Upper Extremity Assessment: Generalized weakness           Lower Extremity Assessment: Generalized weakness;RLE deficits/detail;LLE deficits/detail RLE Deficits / Details: Residual weakness from prior CVA LLE Deficits / Details: Decreased strength, AROM and acute pain consistent with multiple falls landing on the L hip (per pt report).  Cervical / Trunk Assessment: Normal  Communication   Communication: No difficulties  Cognition Arousal/Alertness: Awake/alert Behavior During Therapy: WFL for tasks assessed/performed Overall Cognitive Status: Within Functional Limits for tasks assessed                      General Comments      Exercises        Assessment/Plan    PT Assessment Patient needs continued PT services  PT Diagnosis Difficulty walking;Acute pain   PT Problem List Decreased strength;Decreased range of motion;Decreased activity tolerance;Decreased balance;Decreased mobility;Decreased knowledge of use of DME;Decreased safety awareness;Decreased knowledge of precautions;Pain  PT Treatment Interventions DME instruction;Gait training;Stair training;Functional mobility training;Therapeutic activities;Therapeutic exercise;Neuromuscular re-education;Patient/family education   PT Goals (Current goals can be found in the Care Plan section) Acute Rehab PT Goals Patient Stated Goal: Not fall anymore PT Goal Formulation: With patient Time For Goal Achievement: 12/20/14 Potential to Achieve Goals: Good    Frequency Min 2X/week   Barriers to discharge        Co-evaluation               End of Session Equipment Utilized During Treatment: Gait belt Activity Tolerance: Patient limited by pain;Patient limited by fatigue Patient left: in chair;with call bell/phone within reach Nurse Communication: Mobility status         Time: 8937-3428 PT Time Calculation (min) (ACUTE ONLY): 21 min   Charges:   PT Evaluation $Initial  PT Evaluation Tier I: 1 Procedure     PT G Codes:        Cameron Santos 17-Dec-2014, 3:12 PM   Cameron Santos, PT, DPT Acute Rehabilitation Services Pager: [phone]

## 2014-12-06 NOTE — Progress Notes (Signed)
UR Completed Yaquelin Langelier Graves-Bigelow, RN,BSN [phone]  

## 2014-12-06 NOTE — Progress Notes (Signed)
INITIAL NUTRITION ASSESSMENT  DOCUMENTATION CODES Per approved criteria  -Severe malnutrition in the context of chronic illness   Pt meets criteria for severe MALNUTRITION in the context of chronic illness as evidenced by severe fat and muscle depletion, 21% wt loss x 1 month.  INTERVENTION: Continue Ensure Enlive po TID, each supplement provides 350 kcal and 20 grams of protein  NUTRITION DIAGNOSIS: Malnutrition related to decreased oral intake as evidenced by severe fat and muscle depletion.   Goal: Pt will meet >90% of estimated nutritional needs  Monitor:  PO/supplement intake, labs, weight changes, I/O's  Reason for Assessment: MD Consult to assess needs  64 y.o. male  Admitting Dx: SIRS (systemic inflammatory response syndrome)  Cameron Santos is a 64 y.o. male with a history of CVA, Hepatitis A, B,and C, w presented to the ED with complaints of myalgias and low grade fevers and chills x 3 days. He was found to have tachycardia and orthostatic hypotension  ASSESSMENT: Pt admitted with SIRS. Pt endorses poor appetite and weight loss PTA. He reports he was recently hospitalized for CVA at the [place] about one month ago (21%), which is when the weight loss started. He reports UBW is 125#, which is confirmed by wt hx.  He reports that he has no difficulty chewing or swallowing foods. However, he complains of loss of appetite, particularly with strong food odors. PTA he admits to grazing on snacks.  Pt was consuming Ensure at bedside table- noted order for TID. He reports he enjoys supplement and was consuming PTA, as the [place] provided him a case after discharge.  Discussed importance of good meal intake and supplements to promote healing.  Labs reviewed. Na: 134, K: 3.3.   Nutrition Focused Physical Exam:  Subcutaneous Fat:  Orbital Region: severe depletion Upper Arm Region: severe depletion Thoracic and Lumbar Region: severe depletion  Muscle:  Temple Region: severe  depletion Clavicle Bone Region: severe depletion Clavicle and Acromion Bone Region: severe depletion Scapular Bone Region: severe depletion Dorsal Hand: severe depletion Patellar Region: severe depletion Anterior Thigh Region: severe depletion Posterior Calf Region: severe depletion  Edema: none present  Height: Ht Readings from Last 1 Encounters:  12/04/14 5\' 7"  (1.702 m)    Weight: Wt Readings from Last 1 Encounters:  12/06/14 99 lb 8 oz (45.133 kg)    Ideal Body Weight: 148#  % Ideal Body Weight: 67%  Wt Readings from Last 10 Encounters:  12/06/14 99 lb 8 oz (45.133 kg)  10/04/14 126 lb (57.153 kg)  05/11/14 135 lb (61.236 kg)  07/07/13 130 lb (58.968 kg)    Usual Body Weight: 125#  % Usual Body Weight: 79%  BMI:  Body mass index is 15.58 kg/(m^2). Underweight  Estimated Nutritional Needs: Kcal: 1500-1700 Protein: 75-85 grams Fluid: 1.5-1.7 L  Skin: Intact  Diet Order: Diet regular  EDUCATION NEEDS: -Education needs addressed   Intake/Output Summary (Last 24 hours) at 12/06/14 1002 Last data filed at 12/06/14 0600  Gross per 24 hour  Intake 2976.67 ml  Output   1450 ml  Net 1526.67 ml    Last BM: PTA  Labs:   Recent Labs Lab 12/04/14 1630 12/05/14 0005 12/05/14 0458 12/06/14 0635  NA  --  134* 134* 134*  K  --  4.0 4.0 3.3*  CL  --  106 106 106  CO2  --  22 24 24   BUN  --  11 11 6   CREATININE  --  0.76 0.66 0.61  CALCIUM  --  8.8 8.5 8.5  MG 1.8  --   --   --   GLUCOSE  --  115* 86 95    CBG (last 3)  No results for input(s): GLUCAP in the last 72 hours.  Scheduled Meds: . omega-3 acid ethyl esters  1 g Oral Daily   And  . cholecalciferol  1,000 Units Oral Daily  . feeding supplement (ENSURE)  1 Container Oral TID BM  . heparin  5,000 Units Subcutaneous 3 times per day  . sodium chloride  3 mL Intravenous Q12H    Continuous Infusions: . sodium chloride 100 mL/hr at 12/06/14 8333    Past Medical History  Diagnosis  Date  . Thyroid disease   . Syncope and collapse     "here lately" (07/07/2013)  . Cirrhosis of liver     "one step up from cirrhosis" (07/07/2013)  . MI, old 2011  . Lower extremity numbness     "feet and legs go to sleep randomly" (07/07/2013)  . Chronic bronchitis     "spring q yr; comes w/the pollen" (07/07/2013)  . Depression   . Traumatic closed displaced fracture of multiple ribs of left side     /notes 07/07/2013; "fell ~ 1 wk ago" (07/07/2013)  . Chronic back pain     with sciatica  . Malnutrition   . Hepatitis C virus   . Hepatitis A   . Hepatitis B   . CVA (cerebral vascular accident) 2011    "weak on my right arm & leg since" (12/05/2014)  . SIRS (systemic inflammatory response syndrome) hospitalized 12/05/2014  . OVANVBTY(606.0)     "a couple times/month" (12/05/2014)  . Epilepsy     "epilepsy fits in my teens; grew out of them before I went into the [employer]"   . Liver cancer     "been diagnosed w/moderate liver cancer" (12/05/2014)    Past Surgical History  Procedure Laterality Date  . Liver surgery  2011    "went in to kill 2 spots on my liver; killed one of them; never did kill the 2nd one" (07/07/2013)  . Circumcision  ~ [place]. Jimmye Norman, RD, LDN, CDE Pager: [phone] After hours Pager: [phone]

## 2014-12-06 NOTE — Care Management Note (Signed)
    Page 1 of 1   12/07/2014     4:33:03 PM CARE MANAGEMENT NOTE 12/07/2014  Patient:  Cameron Santos, Cameron Santos   Account Number:  [id number]  Date Initiated:  12/06/2014  Documentation initiated by:  GRAVES-BIGELOW,Tunya Held  Subjective/Objective Assessment:   Pt admitted for Fever Chills and Body aches.   Pt is from home with wife.     Action/Plan:   PT recommendations for SNF. CM did call the [facility] to make them aware that pt is hospitalized. CSW aware of SNF placement.   Anticipated DC Date:  12/07/2014   Anticipated DC Plan:  SKILLED NURSING FACILITY  In-house referral  Clinical Social Worker      DC Planning Services  CM consult      Choice offered to / List presented to:             Status of service:  Completed, signed off Medicare Important Message given?  NO (If response is "NO", the following Medicare IM given date fields will be blank) Date Medicare IM given:   Medicare IM given by:   Date Additional Medicare IM given:   Additional Medicare IM given by:    Discharge Disposition:  [place]  Per UR Regulation:  Reviewed for med. necessity/level of care/duration of stay  If discussed at Long Length of Stay Meetings, dates discussed:    Comments:  Plan will be for SNF- approved via [place] will provide transportation on Monday from home. No hh services set up for pt at this time.

## 2014-12-06 NOTE — Progress Notes (Signed)
[facility] PROGRESS NOTE  Cameron Santos IZT:[id number] DOB: [birth year] DOA: 12/04/2014 PCP: [facility]  Assessment/Plan: 1. SIRS- patient presented with systemic inflammatory response syndrome, started empirically on vancomycin and cefepime. Blood cultures and urine cultures have been sent influenza panel PCR is negative. Patient's also culture results are negative. We'll discontinue the antibiotics. 2. Severe malnutrition- patient today started on Ensure 1 can by mouth 3 times a day. Dietitian consulted. 3. Deconditioning/recent falls at home- physical therapy eval with the patient in the hospital, and they recommend patient to go to short-term rehabilitation. Patient is agreeable to go to rehabilitation. 4. Hypokalemia- we'll place potassium and check BMP in morning. 5. Orthostatic hypotension- blood pressure improved with IV fluids 6. DVT prophylaxis- heparin  Code Status: Full code Family Communication: *No family at bedside Disposition Plan: Home when stable   Consultants:  None  Procedures:  None  Antibiotics:  Vancomycin  Cefepime  HPI/Subjective: 64 year old male with a history of CVA, hepatitis A,B, C who came with fever chills and body aches. Patient was found to be tachycardic and hypotensive. Patient was started on vancomycin and cefepime empirically, sepsis workup initiated. Influenza PCR panel sent.  Patient seen and examined, denies any new complaints.   Objective: Filed Vitals:   12/06/14 1154  BP: 122/67  Pulse: 93  Temp: 98.7 F (37.1 C)  Resp: 18    Intake/Output Summary (Last 24 hours) at 12/06/14 1431 Last data filed at 12/06/14 1100  Gross per 24 hour  Intake 2826.67 ml  Output   2050 ml  Net 776.67 ml   Filed Weights   12/04/14 2300 12/05/14 0447 12/06/14 0456  Weight: 45.224 kg (99 lb 11.2 oz) 44.951 kg (99 lb 1.6 oz) 45.133 kg (99 lb 8 oz)    Exam:   General:  Appears in no acute distress  Cardiovascular: S1-S2  regular  Respiratory: Clear bilaterally  Abdomen: Soft, nontender no organomegaly  Musculoskeletal: No edema of the lower extremities noted.  Data Reviewed: Basic Metabolic Panel:  Recent Labs Lab 12/04/14 1315 12/04/14 1630 12/05/14 0005 12/05/14 0458 12/06/14 0635  NA 135  --  134* 134* 134*  K 4.3  --  4.0 4.0 3.3*  CL 100  --  106 106 106  CO2 25  --  22 24 24   GLUCOSE 115*  --  115* 86 95  BUN 12  --  11 11 6   CREATININE 0.81  --  0.76 0.66 0.61  CALCIUM 9.8  --  8.8 8.5 8.5  MG  --  1.8  --   --   --    Liver Function Tests:  Recent Labs Lab 12/04/14 1630 12/05/14 0005  AST 91* 66*  ALT 44 36  ALKPHOS 120* 105  BILITOT 1.2 1.4*  PROT 8.1 7.0  ALBUMIN 2.2* 2.0*    Recent Labs Lab 12/04/14 1630  LIPASE 30   No results for input(s): AMMONIA in the last 168 hours. CBC:  Recent Labs Lab 12/04/14 1315 12/05/14 0005 12/05/14 0458 12/06/14 0635  WBC 11.7* 9.6 8.9 8.1  NEUTROABS 8.8*  --   --   --   HGB 12.6* 11.2* 9.8* 10.3*  HCT 37.2* 33.2* 28.8* 30.5*  MCV 86.9 86.9 85.5 85.4  PLT 362 265 231 231   Cardiac Enzymes:  Recent Labs Lab 12/04/14 1630  CKTOTAL 40  TROPONINI 0.03   BNP (last 3 results) No results for input(s): BNP in the last 8760 hours.  ProBNP (last 3 results)  No results for input(s): PROBNP in the last 8760 hours.  CBG: No results for input(s): GLUCAP in the last 168 hours.  Recent Results (from the past 240 hour(s))  Urine culture     Status: None   Collection Time: 12/04/14  7:44 PM  Result Value Ref Range Status   Specimen Description URINE, CLEAN CATCH  Final   Special Requests NONE  Final   Colony Count NO GROWTH Performed at [facility]   Final   Culture NO GROWTH Performed at [facility]   Final   Report Status 12/06/2014 FINAL  Final  MRSA PCR Screening     Status: None   Collection Time: 12/04/14 11:01 PM  Result Value Ref Range Status   MRSA by PCR NEGATIVE NEGATIVE Final     Comment:        The GeneXpert MRSA Assay (FDA approved for NASAL specimens only), is one component of a comprehensive MRSA colonization surveillance program. It is not intended to diagnose MRSA infection nor to guide or monitor treatment for MRSA infections.   Culture, blood (x 2)     Status: None (Preliminary result)   Collection Time: 12/04/14 11:40 PM  Result Value Ref Range Status   Specimen Description BLOOD LEFT ARM  Final   Special Requests   Final    BOTTLES DRAWN AEROBIC AND ANAEROBIC 10CC BLUE [place] RED   Culture   Final           BLOOD CULTURE RECEIVED NO GROWTH TO DATE CULTURE WILL BE HELD FOR 5 DAYS BEFORE ISSUING A FINAL NEGATIVE REPORT Performed at [facility]    Report Status PENDING  Incomplete  Culture, blood (x 2)     Status: None (Preliminary result)   Collection Time: 12/05/14 12:05 AM  Result Value Ref Range Status   Specimen Description BLOOD LEFT ARM  Final   Special Requests BOTTLES DRAWN AEROBIC ONLY 5CCS  Final   Culture   Final           BLOOD CULTURE RECEIVED NO GROWTH TO DATE CULTURE WILL BE HELD FOR 5 DAYS BEFORE ISSUING A FINAL NEGATIVE REPORT Performed at [facility]    Report Status PENDING  Incomplete     Studies: Dg Chest 2 View  12/04/2014   CLINICAL DATA:  Generalized body aches  EXAM: CHEST  2 VIEW  COMPARISON:  07/07/2013  FINDINGS: Normal heart size and mediastinal contours.  Minimal scar or atelectasis posterior to the trachea and overlapping the lower heart in the lateral projection. No consolidation or edema. No effusion or pneumothorax. No acute osseous findings.  IMPRESSION: No active cardiopulmonary disease.   Electronically Signed   By: Monte Fantasia M.D.   On: 12/04/2014 18:04    Scheduled Meds: . omega-3 acid ethyl esters  1 g Oral Daily   And  . cholecalciferol  1,000 Units Oral Daily  . feeding supplement (ENSURE)  1 Container Oral TID BM  . heparin  5,000 Units Subcutaneous 3 times per day  . sodium  chloride  3 mL Intravenous Q12H   Continuous Infusions: . sodium chloride 100 mL/hr at 12/06/14 0973    Principal Problem:   SIRS (systemic inflammatory response syndrome) Active Problems:   Malnutrition of moderate degree   Tachycardia   Orthostatic hypotension   Thyroid disease   Dehydration   Protein-calorie malnutrition, severe    Time spent: 30 minutes    [place] [facility] Pager [phone]. If 7PM-7AM,  please contact night-coverage at [email or web address], password [facility] 12/06/2014, 2:31 PM  LOS: 2 days

## 2014-12-07 LAB — COMPREHENSIVE METABOLIC PANEL
ALBUMIN: 1.7 g/dL — AB (ref 3.5–5.2)
ALK PHOS: 99 U/L (ref 39–117)
ALT: 33 U/L (ref 0–53)
ANION GAP: 3 — AB (ref 5–15)
AST: 58 U/L — ABNORMAL HIGH (ref 0–37)
BILIRUBIN TOTAL: 0.9 mg/dL (ref 0.3–1.2)
BUN: 5 mg/dL — AB (ref 6–23)
CHLORIDE: 106 mmol/L (ref 96–112)
CO2: 25 mmol/L (ref 19–32)
Calcium: 8.6 mg/dL (ref 8.4–10.5)
Creatinine, Ser: 0.58 mg/dL (ref 0.50–1.35)
GFR calc Af Amer: >90 mL/min (ref >90)
GFR calc non Af Amer: >90 mL/min (ref >90)
GLUCOSE: 84 mg/dL (ref 70–99)
POTASSIUM: 4 mmol/L (ref 3.5–5.1)
Sodium: 134 mmol/L — ABNORMAL LOW (ref 135–145)
Total Protein: 6.2 g/dL (ref 6.0–8.3)

## 2014-12-07 MED ORDER — MEGESTROL ACETATE 400 MG/10ML PO SUSP
400.0000 mg | Freq: Two times a day (BID) | ORAL | Status: DC
  Administered 2014-12-07 – 2014-12-10 (×7): 400 mg via ORAL
  Filled 2014-12-07 (×8): qty 10

## 2014-12-07 MED ORDER — POTASSIUM CHLORIDE 10 MEQ/100ML IV SOLN: 10.0000 meq | INTRAVENOUS | Status: DC

## 2014-12-07 MED ORDER — ENSURE PUDDING PO PUDG: 1.0000 | Freq: Three times a day (TID) | ORAL | Status: AC

## 2014-12-07 NOTE — Progress Notes (Addendum)
CSW (Clinical Education officer, museum) received call from [employer] informing that pt has been approved for [place] next week. CSW attempting to return phone call to notify that pt medically stable for dc from hospital. CSW wanting to confirm pt can dc home and go to [place] facility next week.    ADDENDUM: CSW spoke with Briscoe Burns ([place] social worker) who informed CSW pt can go to [place] facility from home on Monday as long as he is there by 10am. CSW will need to fax dc summary [phone]. Pt will go to [place]. [place] not available over weekend but pt can reach him at [phone] ext. [phone]. CSW to provide pt with above information at discharge.  CSW spoke with pt who is agreeable to go to [facility] but does not know if he can arrange transportation. CSW unable to reach pt wife and pt niece stated she cannot provide transport (phone number in pt room). Pt niece agreed to speak with her brother and call CSW back. No return phone call received by 4:55pm.  CSW spoke with Delfino Lovett again who informed CSW that pt is [facility] and he has put in the request for Monday morning. However, this cannot be confirmed at transportation services are out of the building already today.  CSW reviewed case with CSW [place]. Attempting to see if pt can dc to [facility] over the weekend instead. [facility] confirmed they can accept pt if needed if [place] approves. CSW left voicemail for Delfino Lovett to inquire.  [place], [place]

## 2014-12-07 NOTE — Progress Notes (Signed)
[facility] PROGRESS NOTE  Cameron Santos GNF:[id number] DOB: May 22, 1951 DOA: 12/04/2014 PCP: [facility]  Assessment/Plan: 1. SIRS-  Resolved, patient presented with systemic inflammatory response syndrome, started empirically on vancomycin and cefepime. Blood cultures and urine cultures have been sent influenza panel PCR is negative. Patient's also culture results are negative. Antibiotics were discontinued yesterday. 2. Severe malnutrition- patient today started on Ensure 1 can by mouth 3 times a day. Dietitian consulted. Also started megace while in the hospital 3. Deconditioning/recent falls at home- physical therapy eval with the patient in the hospital, and they recommend patient to go to short-term rehabilitation. Patient is agreeable to go to rehabilitation. 4. Hypokalemia- replaced, today the potassium is 4.0 5. Orthostatic hypotension-  Resolved, blood pressure improved with IV fluids. Will change the fluids to kvo. 6. DVT prophylaxis- heparin  Code Status: Full code Family Communication: *No family at bedside Disposition Plan: Home when stable   Consultants:  None  Procedures:  None  Antibiotics:  Vancomycin  Cefepime  HPI/Subjective: 64 year old male with a history of CVA, hepatitis A,B, C who came with fever chills and body aches. Patient was found to be tachycardic and hypotensive. Patient was started on vancomycin and cefepime empirically, sepsis workup initiated. Influenza PCR panel sent.  Patient seen and examined, denies any new complaints. He is agreeable to go to rehab.   Objective: Filed Vitals:   12/07/14 1247  BP: 111/55  Pulse: 93  Temp: 98.8 F (37.1 C)  Resp:     Intake/Output Summary (Last 24 hours) at 12/07/14 1320 Last data filed at 12/07/14 0600  Gross per 24 hour  Intake   2640 ml  Output    901 ml  Net   1739 ml   Filed Weights   12/05/14 0447 12/06/14 0456 12/07/14 0557  Weight: 44.951 kg (99 lb 1.6 oz) 45.133 kg  (99 lb 8 oz) 46.72 kg (103 lb)    Exam:   General:  Appears in no acute distress  Cardiovascular: S1-S2 regular  Respiratory: Clear bilaterally  Abdomen: Soft, nontender no organomegaly  Musculoskeletal: No edema of the lower extremities noted.  Data Reviewed: Basic Metabolic Panel:  Recent Labs Lab 12/04/14 1315 12/04/14 1630 12/05/14 0005 12/05/14 0458 12/06/14 0635 12/07/14 0531  NA 135  --  134* 134* 134* 134*  K 4.3  --  4.0 4.0 3.3* 4.0  CL 100  --  106 106 106 106  CO2 25  --  22 24 24 25   GLUCOSE 115*  --  115* 86 95 84  BUN 12  --  11 11 6  5*  CREATININE 0.81  --  0.76 0.66 0.61 0.58  CALCIUM 9.8  --  8.8 8.5 8.5 8.6  MG  --  1.8  --   --   --   --    Liver Function Tests:  Recent Labs Lab 12/04/14 1630 12/05/14 0005 12/07/14 0531  AST 91* 66* 58*  ALT 44 36 33  ALKPHOS 120* 105 99  BILITOT 1.2 1.4* 0.9  PROT 8.1 7.0 6.2  ALBUMIN 2.2* 2.0* 1.7*    Recent Labs Lab 12/04/14 1630  LIPASE 30   No results for input(s): AMMONIA in the last 168 hours. CBC:  Recent Labs Lab 12/04/14 1315 12/05/14 0005 12/05/14 0458 12/06/14 0635  WBC 11.7* 9.6 8.9 8.1  NEUTROABS 8.8*  --   --   --   HGB 12.6* 11.2* 9.8* 10.3*  HCT 37.2* 33.2* 28.8* 30.5*  MCV 86.9  86.9 85.5 85.4  PLT 362 265 231 231   Cardiac Enzymes:  Recent Labs Lab 12/04/14 1630  CKTOTAL 40  TROPONINI 0.03   BNP (last 3 results) No results for input(s): BNP in the last 8760 hours.  ProBNP (last 3 results) No results for input(s): PROBNP in the last 8760 hours.  CBG: No results for input(s): GLUCAP in the last 168 hours.  Recent Results (from the past 240 hour(s))  Urine culture     Status: None   Collection Time: 12/04/14  7:44 PM  Result Value Ref Range Status   Specimen Description URINE, CLEAN CATCH  Final   Special Requests NONE  Final   Colony Count NO GROWTH Performed at [facility]   Final   Culture NO GROWTH Performed at [facility]    Final   Report Status 12/06/2014 FINAL  Final  MRSA PCR Screening     Status: None   Collection Time: 12/04/14 11:01 PM  Result Value Ref Range Status   MRSA by PCR NEGATIVE NEGATIVE Final    Comment:        The GeneXpert MRSA Assay (FDA approved for NASAL specimens only), is one component of a comprehensive MRSA colonization surveillance program. It is not intended to diagnose MRSA infection nor to guide or monitor treatment for MRSA infections.   Culture, blood (x 2)     Status: None (Preliminary result)   Collection Time: 12/04/14 11:40 PM  Result Value Ref Range Status   Specimen Description BLOOD LEFT ARM  Final   Special Requests   Final    BOTTLES DRAWN AEROBIC AND ANAEROBIC 10CC BLUE 6CC RED   Culture   Final           BLOOD CULTURE RECEIVED NO GROWTH TO DATE CULTURE WILL BE HELD FOR 5 DAYS BEFORE ISSUING A FINAL NEGATIVE REPORT Performed at [facility]    Report Status PENDING  Incomplete  Culture, blood (x 2)     Status: None (Preliminary result)   Collection Time: 12/05/14 12:05 AM  Result Value Ref Range Status   Specimen Description BLOOD LEFT ARM  Final   Special Requests BOTTLES DRAWN AEROBIC ONLY 5CCS  Final   Culture   Final           BLOOD CULTURE RECEIVED NO GROWTH TO DATE CULTURE WILL BE HELD FOR 5 DAYS BEFORE ISSUING A FINAL NEGATIVE REPORT Performed at [facility]    Report Status PENDING  Incomplete     Studies: No results found.  Scheduled Meds: . omega-3 acid ethyl esters  1 g Oral Daily   And  . cholecalciferol  1,000 Units Oral Daily  . feeding supplement (ENSURE)  1 Container Oral TID BM  . heparin  5,000 Units Subcutaneous 3 times per day  . megestrol  400 mg Oral BID  . sodium chloride  3 mL Intravenous Q12H   Continuous Infusions: . sodium chloride 100 mL/hr at 12/07/14 4562    Principal Problem:   SIRS (systemic inflammatory response syndrome) Active Problems:   Malnutrition of moderate degree    Tachycardia   Orthostatic hypotension   Thyroid disease   Dehydration   Protein-calorie malnutrition, severe    Time spent: 30 minutes    [facility] Pager [phone]. If 7PM-7AM, please contact night-coverage at [email or web address], password [email or web address] 12/07/2014, 1:20 PM  LOS: 3 days

## 2014-12-07 NOTE — Progress Notes (Addendum)
CSW (Clinical Education officer, museum) received call from [employer] with further questions on referral sent in for SNF. CSw answered questions and confirmed pt information will be reviewed today. [facility] confirmed [facility] will [facility] at 2pm today and call CSW with decision after.  CSW also called [facility] and left voicemail for admissions staff notifying that pt will be ready for dc today potentially to their facility if approved by [place]. Awaiting return phone call.  [place], [place]

## 2014-12-08 LAB — TSH: TSH: 1.49 u[IU]/mL (ref 0.350–4.500)

## 2014-12-08 LAB — CORTISOL-AM, BLOOD: Cortisol - AM: 4.2 ug/dL — ABNORMAL LOW (ref 4.3–22.4)

## 2014-12-08 MED ORDER — CHLORHEXIDINE GLUCONATE CLOTH 2 % EX PADS: 6.0000 | MEDICATED_PAD | Freq: Once | CUTANEOUS | Status: DC

## 2014-12-08 NOTE — Progress Notes (Signed)
[facility] PROGRESS NOTE  Cameron Santos NMM:[id number] DOB: Feb 15, 1951 DOA: 12/04/2014 PCP: [facility]  Assessment/Plan: 1. SIRS-  Resolved, patient presented with systemic inflammatory response syndrome, started empirically on vancomycin and cefepime. Blood cultures and urine cultures have been sent influenza panel PCR is negative. Patient's also culture results are negative. Antibiotics were discontinued yesterday. 2. Severe malnutrition- patient  started on Ensure 1 can by mouth 3 times a day. Dietitian consulted. Also started megace while in the hospital. 3. Deconditioning/recent falls at home- physical therapy eval with the patient in the hospital, and they recommend patient to go to short-term rehabilitation. Patient is agreeable to go to rehabilitation. 4. Hypokalemia- replaced, today the potassium is 4.0 5. Orthostatic hypotension-  Resolved, blood pressure improved with IV fluids. Will change the fluids to kvo. Orthostatics negative. We'll also check serum cortisol level. 6. DVT prophylaxis- heparin  Code Status: Full code Family Communication: *No family at bedside Disposition Plan: Skilled nursing facility   Consultants:  None  Procedures:  None  Antibiotics:  Vancomycin stopped 12/06/2014  Cefepime stopped 12/06/2014  HPI/Subjective: 64 year old male with a history of CVA, hepatitis A,B, C who came with fever chills and body aches. Patient was found to be tachycardic and hypotensive. Patient was started on vancomycin and cefepime empirically, sepsis workup initiated. Influenza PCR panel sent.  Patient seen and examined, denies any new complaints. Yesterday patient became very weak on ambulating. Tachycardic with heart rate to 120s on standing. Orthostatic blood pressure was negative.   Objective: Filed Vitals:   12/08/14 0800  BP: 118/71  Pulse: 94  Temp: 98.3 F (36.8 C)  Resp: 18    Intake/Output Summary (Last 24 hours) at 12/08/14  1034 Last data filed at 12/08/14 0400  Gross per 24 hour  Intake    360 ml  Output    275 ml  Net     85 ml   Filed Weights   12/06/14 0456 12/07/14 0557 12/08/14 0400  Weight: 45.133 kg (99 lb 8 oz) 46.72 kg (103 lb) 43.772 kg (96 lb 8 oz)    Exam:   General:  Appears in no acute distress  Cardiovascular: S1-S2 regular  Respiratory: Clear bilaterally  Abdomen: Soft, nontender no organomegaly  Musculoskeletal: No edema of the lower extremities noted.  Data Reviewed: Basic Metabolic Panel:  Recent Labs Lab 12/04/14 1315 12/04/14 1630 12/05/14 0005 12/05/14 0458 12/06/14 0635 12/07/14 0531  NA 135  --  134* 134* 134* 134*  K 4.3  --  4.0 4.0 3.3* 4.0  CL 100  --  106 106 106 106  CO2 25  --  22 24 24 25   GLUCOSE 115*  --  115* 86 95 84  BUN 12  --  11 11 6  5*  CREATININE 0.81  --  0.76 0.66 0.61 0.58  CALCIUM 9.8  --  8.8 8.5 8.5 8.6  MG  --  1.8  --   --   --   --    Liver Function Tests:  Recent Labs Lab 12/04/14 1630 12/05/14 0005 12/07/14 0531  AST 91* 66* 58*  ALT 44 36 33  ALKPHOS 120* 105 99  BILITOT 1.2 1.4* 0.9  PROT 8.1 7.0 6.2  ALBUMIN 2.2* 2.0* 1.7*    Recent Labs Lab 12/04/14 1630  LIPASE 30   No results for input(s): AMMONIA in the last 168 hours. CBC:  Recent Labs Lab 12/04/14 1315 12/05/14 0005 12/05/14 0458 12/06/14 0635  WBC 11.7* 9.6 8.9  8.1  NEUTROABS 8.8*  --   --   --   HGB 12.6* 11.2* 9.8* 10.3*  HCT 37.2* 33.2* 28.8* 30.5*  MCV 86.9 86.9 85.5 85.4  PLT 362 265 231 231   Cardiac Enzymes:  Recent Labs Lab 12/04/14 1630  CKTOTAL 40  TROPONINI 0.03   BNP (last 3 results) No results for input(s): BNP in the last 8760 hours.  ProBNP (last 3 results) No results for input(s): PROBNP in the last 8760 hours.  CBG: No results for input(s): GLUCAP in the last 168 hours.  Recent Results (from the past 240 hour(s))  Urine culture     Status: None   Collection Time: 12/04/14  7:44 PM  Result Value Ref Range  Status   Specimen Description URINE, CLEAN CATCH  Final   Special Requests NONE  Final   Colony Count NO GROWTH Performed at [facility]   Final   Culture NO GROWTH Performed at [facility]   Final   Report Status 12/06/2014 FINAL  Final  MRSA PCR Screening     Status: None   Collection Time: 12/04/14 11:01 PM  Result Value Ref Range Status   MRSA by PCR NEGATIVE NEGATIVE Final    Comment:        The GeneXpert MRSA Assay (FDA approved for NASAL specimens only), is one component of a comprehensive MRSA colonization surveillance program. It is not intended to diagnose MRSA infection nor to guide or monitor treatment for MRSA infections.   Culture, blood (x 2)     Status: None (Preliminary result)   Collection Time: 12/04/14 11:40 PM  Result Value Ref Range Status   Specimen Description BLOOD LEFT ARM  Final   Special Requests   Final    BOTTLES DRAWN AEROBIC AND ANAEROBIC 10CC BLUE 6CC RED   Culture   Final           BLOOD CULTURE RECEIVED NO GROWTH TO DATE CULTURE WILL BE HELD FOR 5 DAYS BEFORE ISSUING A FINAL NEGATIVE REPORT Performed at [facility]    Report Status PENDING  Incomplete  Culture, blood (x 2)     Status: None (Preliminary result)   Collection Time: 12/05/14 12:05 AM  Result Value Ref Range Status   Specimen Description BLOOD LEFT ARM  Final   Special Requests BOTTLES DRAWN AEROBIC ONLY 5CCS  Final   Culture   Final           BLOOD CULTURE RECEIVED NO GROWTH TO DATE CULTURE WILL BE HELD FOR 5 DAYS BEFORE ISSUING A FINAL NEGATIVE REPORT Performed at [facility]    Report Status PENDING  Incomplete     Studies: No results found.  Scheduled Meds: . omega-3 acid ethyl esters  1 g Oral Daily   And  . cholecalciferol  1,000 Units Oral Daily  . feeding supplement (ENSURE)  1 Container Oral TID BM  . heparin  5,000 Units Subcutaneous 3 times per day  . megestrol  400 mg Oral BID  . sodium chloride  3 mL Intravenous  Q12H   Continuous Infusions: . sodium chloride 10 mL/hr at 12/07/14 1322    Principal Problem:   SIRS (systemic inflammatory response syndrome) Active Problems:   Malnutrition of moderate degree   Tachycardia   Orthostatic hypotension   Thyroid disease   Dehydration   Protein-calorie malnutrition, severe    Time spent: 25 minutes    [place] [facility] Pager [phone]. If 7PM-7AM, please  contact night-coverage at [email or web address], password [facility] 12/08/2014, 10:34 AM  LOS: 4 days

## 2014-12-09 NOTE — Progress Notes (Signed)
Error

## 2014-12-09 NOTE — Progress Notes (Signed)
[facility] PROGRESS NOTE  Cameron Santos BRA:[id number] DOB: 1951/03/20 DOA: 12/04/2014 PCP: [facility]  Assessment/Plan: 1. SIRS-  Resolved, patient presented with systemic inflammatory response syndrome, started empirically on vancomycin and cefepime. Blood cultures and urine cultures have been sent influenza panel PCR is negative. Patient's also culture results are negative. Antibiotics were discontinued yesterday. 2. Severe malnutrition- patient  started on Ensure 1 can by mouth 3 times a day. Dietitian consulted. Also started megace while in the hospital. 3. Deconditioning/recent falls at home- physical therapy eval with the patient in the hospital, and they recommend patient to go to short-term rehabilitation. Patient is agreeable to go to rehabilitation. 4. Hypokalemia- replaced, today the potassium is 4.0 5. Orthostatic hypotension-  Resolved, blood pressure improved with IV fluids. Will change the fluids to kvo. Orthostatics negative. Cortisol level is 4.2, will recheck the cortisol in am. 6. DVT prophylaxis- heparin  Code Status: Full code Family Communication: *No family at bedside Disposition Plan: Skilled nursing facility   Consultants:  None  Procedures:  None  Antibiotics:  Vancomycin stopped 12/06/2014  Cefepime stopped 12/06/2014  HPI/Subjective: 64 year old male with a history of CVA, hepatitis A,B, C who came with fever chills and body aches. Patient was found to be tachycardic and hypotensive. Patient was started on vancomycin and cefepime empirically, sepsis workup initiated. Influenza PCR panel sent.  Patient seen and examined, no new complaints. Cortisol level is 4.2   Objective: Filed Vitals:   12/09/14 0524  BP: 115/72  Pulse: 98  Temp: 98.5 F (36.9 C)  Resp: 18    Intake/Output Summary (Last 24 hours) at 12/09/14 1419 Last data filed at 12/09/14 0500  Gross per 24 hour  Intake      0 ml  Output    300 ml  Net   -300 ml    Filed Weights   12/07/14 0557 12/08/14 0400 12/09/14 0524  Weight: 46.72 kg (103 lb) 43.772 kg (96 lb 8 oz) 44.453 kg (98 lb)    Exam:   General:  Appears in no acute distress  Cardiovascular: S1-S2 regular  Respiratory: Clear bilaterally  Abdomen: Soft, nontender no organomegaly  Musculoskeletal: No edema of the lower extremities noted.  Data Reviewed: Basic Metabolic Panel:  Recent Labs Lab 12/04/14 1315 12/04/14 1630 12/05/14 0005 12/05/14 0458 12/06/14 0635 12/07/14 0531  NA 135  --  134* 134* 134* 134*  K 4.3  --  4.0 4.0 3.3* 4.0  CL 100  --  106 106 106 106  CO2 25  --  22 24 24 25   GLUCOSE 115*  --  115* 86 95 84  BUN 12  --  11 11 6  5*  CREATININE 0.81  --  0.76 0.66 0.61 0.58  CALCIUM 9.8  --  8.8 8.5 8.5 8.6  MG  --  1.8  --   --   --   --    Liver Function Tests:  Recent Labs Lab 12/04/14 1630 12/05/14 0005 12/07/14 0531  AST 91* 66* 58*  ALT 44 36 33  ALKPHOS 120* 105 99  BILITOT 1.2 1.4* 0.9  PROT 8.1 7.0 6.2  ALBUMIN 2.2* 2.0* 1.7*    Recent Labs Lab 12/04/14 1630  LIPASE 30   No results for input(s): AMMONIA in the last 168 hours. CBC:  Recent Labs Lab 12/04/14 1315 12/05/14 0005 12/05/14 0458 12/06/14 0635  WBC 11.7* 9.6 8.9 8.1  NEUTROABS 8.8*  --   --   --   HGB  12.6* 11.2* 9.8* 10.3*  HCT 37.2* 33.2* 28.8* 30.5*  MCV 86.9 86.9 85.5 85.4  PLT 362 265 231 231   Cardiac Enzymes:  Recent Labs Lab 12/04/14 1630  CKTOTAL 40  TROPONINI 0.03   BNP (last 3 results) No results for input(s): BNP in the last 8760 hours.  ProBNP (last 3 results) No results for input(s): PROBNP in the last 8760 hours.  CBG: No results for input(s): GLUCAP in the last 168 hours.  Recent Results (from the past 240 hour(s))  Urine culture     Status: None   Collection Time: 12/04/14  7:44 PM  Result Value Ref Range Status   Specimen Description URINE, CLEAN CATCH  Final   Special Requests NONE  Final   Colony Count NO  GROWTH Performed at [facility]   Final   Culture NO GROWTH Performed at [facility]   Final   Report Status 12/06/2014 FINAL  Final  MRSA PCR Screening     Status: None   Collection Time: 12/04/14 11:01 PM  Result Value Ref Range Status   MRSA by PCR NEGATIVE NEGATIVE Final    Comment:        The GeneXpert MRSA Assay (FDA approved for NASAL specimens only), is one component of a comprehensive MRSA colonization surveillance program. It is not intended to diagnose MRSA infection nor to guide or monitor treatment for MRSA infections.   Culture, blood (x 2)     Status: None (Preliminary result)   Collection Time: 12/04/14 11:40 PM  Result Value Ref Range Status   Specimen Description BLOOD LEFT ARM  Final   Special Requests   Final    BOTTLES DRAWN AEROBIC AND ANAEROBIC 10CC BLUE 6CC RED   Culture   Final           BLOOD CULTURE RECEIVED NO GROWTH TO DATE CULTURE WILL BE HELD FOR 5 DAYS BEFORE ISSUING A FINAL NEGATIVE REPORT Performed at [facility]    Report Status PENDING  Incomplete  Culture, blood (x 2)     Status: None (Preliminary result)   Collection Time: 12/05/14 12:05 AM  Result Value Ref Range Status   Specimen Description BLOOD LEFT ARM  Final   Special Requests BOTTLES DRAWN AEROBIC ONLY 5CCS  Final   Culture   Final           BLOOD CULTURE RECEIVED NO GROWTH TO DATE CULTURE WILL BE HELD FOR 5 DAYS BEFORE ISSUING A FINAL NEGATIVE REPORT Performed at [facility]    Report Status PENDING  Incomplete     Studies: No results found.  Scheduled Meds: . Chlorhexidine Gluconate Cloth  6 each Topical Once  . omega-3 acid ethyl esters  1 g Oral Daily   And  . cholecalciferol  1,000 Units Oral Daily  . feeding supplement (ENSURE)  1 Container Oral TID BM  . heparin  5,000 Units Subcutaneous 3 times per day  . megestrol  400 mg Oral BID  . sodium chloride  3 mL Intravenous Q12H   Continuous Infusions: . sodium chloride 10  mL/hr at 12/07/14 1322    Principal Problem:   SIRS (systemic inflammatory response syndrome) Active Problems:   Malnutrition of moderate degree   Tachycardia   Orthostatic hypotension   Thyroid disease   Dehydration   Protein-calorie malnutrition, severe    Time spent: 25 minutes    [facility] Pager [phone]. If 7PM-7AM, please contact night-coverage at [email or web address], password  [email or web address] 12/09/2014, 2:19 PM  Santos: 5 days

## 2014-12-10 NOTE — Progress Notes (Signed)
CSW (Clinical Education officer, museum) spoke with Cameron Santos at [facility] and notified pt is still here at the hospital. Cameron Santos informed CSW transport is already in route to pt house. He will see if transport can be re-routed to hospital instead. Cameron Santos returned phone call and informed CSW transport is on the way to pick up pt from the hospital now. CSW paged MD and requested dc summary/order as soon as possible.   [place], [place]

## 2014-12-10 NOTE — Discharge Summary (Addendum)
Physician Discharge Summary  Cameron Santos OZH:[id number] DOB: 20-Jul-1951 DOA: 12/04/2014  PCP: [place] date: 12/04/2014 Discharge date: 12/10/2014  Time spent: 45* minutes  Recommendations for Outpatient Follow-up:  1. Follow-up PCP in 2 weeks 2. Patient had mildly abnormal cortisol level in a.m. 4.2, random cortisol was 21.1. Patient is not hypotensive, orthostatics were negative. Patient will require outpatient endocrine evaluation for ACTH stimulation test 3. Protein calorie malnutrition- seen by nutrition in the hospital started on Ensure one can 3 times a day 4. Outpatient workup for weight loss    Discharge Diagnoses:  Principal Problem:   SIRS (systemic inflammatory response syndrome) Active Problems:   Malnutrition of moderate degree   Tachycardia   Orthostatic hypotension   Thyroid disease   Dehydration   Protein-calorie malnutrition, severe   Discharge Condition: Stable  Diet recommendation: Regular diet him a high-protein diet  Filed Weights   12/08/14 0400 12/09/14 0524 12/10/14 0400  Weight: 43.772 kg (96 lb 8 oz) 44.453 kg (98 lb) 43.319 kg (95 lb 8 oz)    History of present illness:  64 y.o. male with a history of CVA, Hepatitis A, B,and C, w presented to the ED with complaints of myalgias and low grade fevers and chills x 3 days. He was found to have tachycardia and orthostatic hypotension. He was administered IVFs and had improvement in his tachycardia. A sepsis work up was initiated, and he was placed on IV Vancomycin and Cefepime,and an Influenza panel was sent. He was referred for medical admission   Hospital Course:  1. SIRS- Resolved, patient presented with systemic inflammatory response syndrome, started empirically on vancomycin and cefepime. Blood cultures and urine cultures have been negative, influenza panel PCR is negative. Antibiotics were discontinued. Patient has remained afebrile with no signs and symptoms of  infection. 2. Severe malnutrition- patient today started on Ensure 1 can by mouth 3 times a day. Dietitian consulted. Also started megace while in the hospital. Will discontinue Megace at time of discharge. Patient should have outpatient evaluation for weight loss 3. Deconditioning/recent falls at home- physical therapy eval with the patient in the hospital, and they recommend patient to go to short-term rehabilitation. Patient is agreeable to go to rehabilitation. He has been accepted at [facility] and can go on Monday. We will discharge the patient home 4. Hypokalemia- replaced,  the potassium is 4.0 5. Orthostatic hypotension- Resolved, blood pressure improved with IV fluids. Recheck cortisol random was 21.1 on 12/05/2014, a.m. cortisol on 12/08/2014 was 4.2. Orthostatics were negative, patient will need outpatient evaluation with endocrinology for ACTH termination test.  Patient will be discharged to the skilled nursing facility at [facility]. Further workup as outlined above  Procedures:  None  Consultations:  None  Discharge Exam: Filed Vitals:   12/10/14 0748  BP: 101/74  Pulse: 113  Temp: 98.4 F (36.9 C)  Resp: 18    General: Appears in no acute distress Cardiovascular: S1-S2 regular Respiratory: Clear bilaterally  Discharge Instructions   Discharge Instructions    Diet - low sodium heart healthy    Complete by:  As directed      Increase activity slowly    Complete by:  As directed           Current Discharge Medication List    START taking these medications   Details  feeding supplement, ENSURE, (ENSURE) PUDG Take 1 Container by mouth 3 (three) times daily between meals. Refills: 0      CONTINUE  these medications which have NOT CHANGED   Details  aspirin 81 MG chewable tablet Chew 1 tablet (81 mg total) by mouth daily. Qty: 30 tablet, Refills: 0    Fish Oil-Cholecalciferol (FISH OIL + D3 PO) Take 1,000 Units by mouth daily.    Multiple Vitamin  (MULTIVITAMIN WITH MINERALS) TABS Take 1 tablet by mouth daily.    naproxen sodium (ANAPROX) 220 MG tablet Take 220 mg by mouth daily as needed (for pain).    oxyCODONE (OXY IR/ROXICODONE) 5 MG immediate release tablet Take 1 tablet (5 mg total) by mouth every 4 (four) hours as needed for severe pain. Qty: 10 tablet, Refills: 0      STOP taking these medications     predniSONE (DELTASONE) 10 MG tablet        Allergies  Allergen Reactions  . Penicillins Diarrhea and Rash    Has been 20 years since last known reaction      The results of significant diagnostics from this hospitalization (including imaging, microbiology, ancillary and laboratory) are listed below for reference.    Significant Diagnostic Studies: Dg Chest 2 View  12/04/2014   CLINICAL DATA:  Generalized body aches  EXAM: CHEST  2 VIEW  COMPARISON:  07/07/2013  FINDINGS: Normal heart size and mediastinal contours.  Minimal scar or atelectasis posterior to the trachea and overlapping the lower heart in the lateral projection. No consolidation or edema. No effusion or pneumothorax. No acute osseous findings.  IMPRESSION: No active cardiopulmonary disease.   Electronically Signed   By: Monte Fantasia M.D.   On: 12/04/2014 18:04    Microbiology: Recent Results (from the past 240 hour(s))  Urine culture     Status: None   Collection Time: 12/04/14  7:44 PM  Result Value Ref Range Status   Specimen Description URINE, CLEAN CATCH  Final   Special Requests NONE  Final   Colony Count NO GROWTH Performed at [facility]   Final   Culture NO GROWTH Performed at [facility]   Final   Report Status 12/06/2014 FINAL  Final  MRSA PCR Screening     Status: None   Collection Time: 12/04/14 11:01 PM  Result Value Ref Range Status   MRSA by PCR NEGATIVE NEGATIVE Final    Comment:        The GeneXpert MRSA Assay (FDA approved for NASAL specimens only), is one component of a comprehensive MRSA  colonization surveillance program. It is not intended to diagnose MRSA infection nor to guide or monitor treatment for MRSA infections.   Culture, blood (x 2)     Status: None (Preliminary result)   Collection Time: 12/04/14 11:40 PM  Result Value Ref Range Status   Specimen Description BLOOD LEFT ARM  Final   Special Requests   Final    BOTTLES DRAWN AEROBIC AND ANAEROBIC 10CC BLUE 6CC RED   Culture   Final           BLOOD CULTURE RECEIVED NO GROWTH TO DATE CULTURE WILL BE HELD FOR 5 DAYS BEFORE ISSUING A FINAL NEGATIVE REPORT Performed at [facility]    Report Status PENDING  Incomplete  Culture, blood (x 2)     Status: None (Preliminary result)   Collection Time: 12/05/14 12:05 AM  Result Value Ref Range Status   Specimen Description BLOOD LEFT ARM  Final   Special Requests BOTTLES DRAWN AEROBIC ONLY 5CCS  Final   Culture   Final  BLOOD CULTURE RECEIVED NO GROWTH TO DATE CULTURE WILL BE HELD FOR 5 DAYS BEFORE ISSUING A FINAL NEGATIVE REPORT Performed at [facility]    Report Status PENDING  Incomplete     Labs: Basic Metabolic Panel:  Recent Labs Lab 12/04/14 1315 12/04/14 1630 12/05/14 0005 12/05/14 0458 12/06/14 0635 12/07/14 0531  NA 135  --  134* 134* 134* 134*  K 4.3  --  4.0 4.0 3.3* 4.0  CL 100  --  106 106 106 106  CO2 25  --  22 24 24 25   GLUCOSE 115*  --  115* 86 95 84  BUN 12  --  11 11 6  5*  CREATININE 0.81  --  0.76 0.66 0.61 0.58  CALCIUM 9.8  --  8.8 8.5 8.5 8.6  MG  --  1.8  --   --   --   --    Liver Function Tests:  Recent Labs Lab 12/04/14 1630 12/05/14 0005 12/07/14 0531  AST 91* 66* 58*  ALT 44 36 33  ALKPHOS 120* 105 99  BILITOT 1.2 1.4* 0.9  PROT 8.1 7.0 6.2  ALBUMIN 2.2* 2.0* 1.7*    Recent Labs Lab 12/04/14 1630  LIPASE 30   No results for input(s): AMMONIA in the last 168 hours. CBC:  Recent Labs Lab 12/04/14 1315 12/05/14 0005 12/05/14 0458 12/06/14 0635  WBC 11.7* 9.6 8.9 8.1   NEUTROABS 8.8*  --   --   --   HGB 12.6* 11.2* 9.8* 10.3*  HCT 37.2* 33.2* 28.8* 30.5*  MCV 86.9 86.9 85.5 85.4  PLT 362 265 231 231   Cardiac Enzymes:  Recent Labs Lab 12/04/14 1630  CKTOTAL 40  TROPONINI 0.03   BNP: BNP (last 3 results) No results for input(s): BNP in the last 8760 hours.  ProBNP (last 3 results) No results for input(s): PROBNP in the last 8760 hours.  CBG: No results for input(s): GLUCAP in the last 168 hours.     SignedEleonore Chiquito S  [facility] 12/10/2014, 9:42 AM

## 2014-12-11 LAB — CULTURE, BLOOD (ROUTINE X 2)
Culture: NO GROWTH
Culture: NO GROWTH

## 2014-12-11 LAB — CORTISOL-AM, BLOOD: Cortisol - AM: 6.6 ug/dL (ref 4.3–22.4)

## 2015-02-13 DEATH — deceased

## 2016-07-10 IMAGING — DX DG CHEST 2V
2 series · 2 of 2 positions shown · non-contrast
Comparison: 07/07/2013

CLINICAL DATA: Generalized body aches

EXAM:
CHEST  2 VIEW

[chest lat]
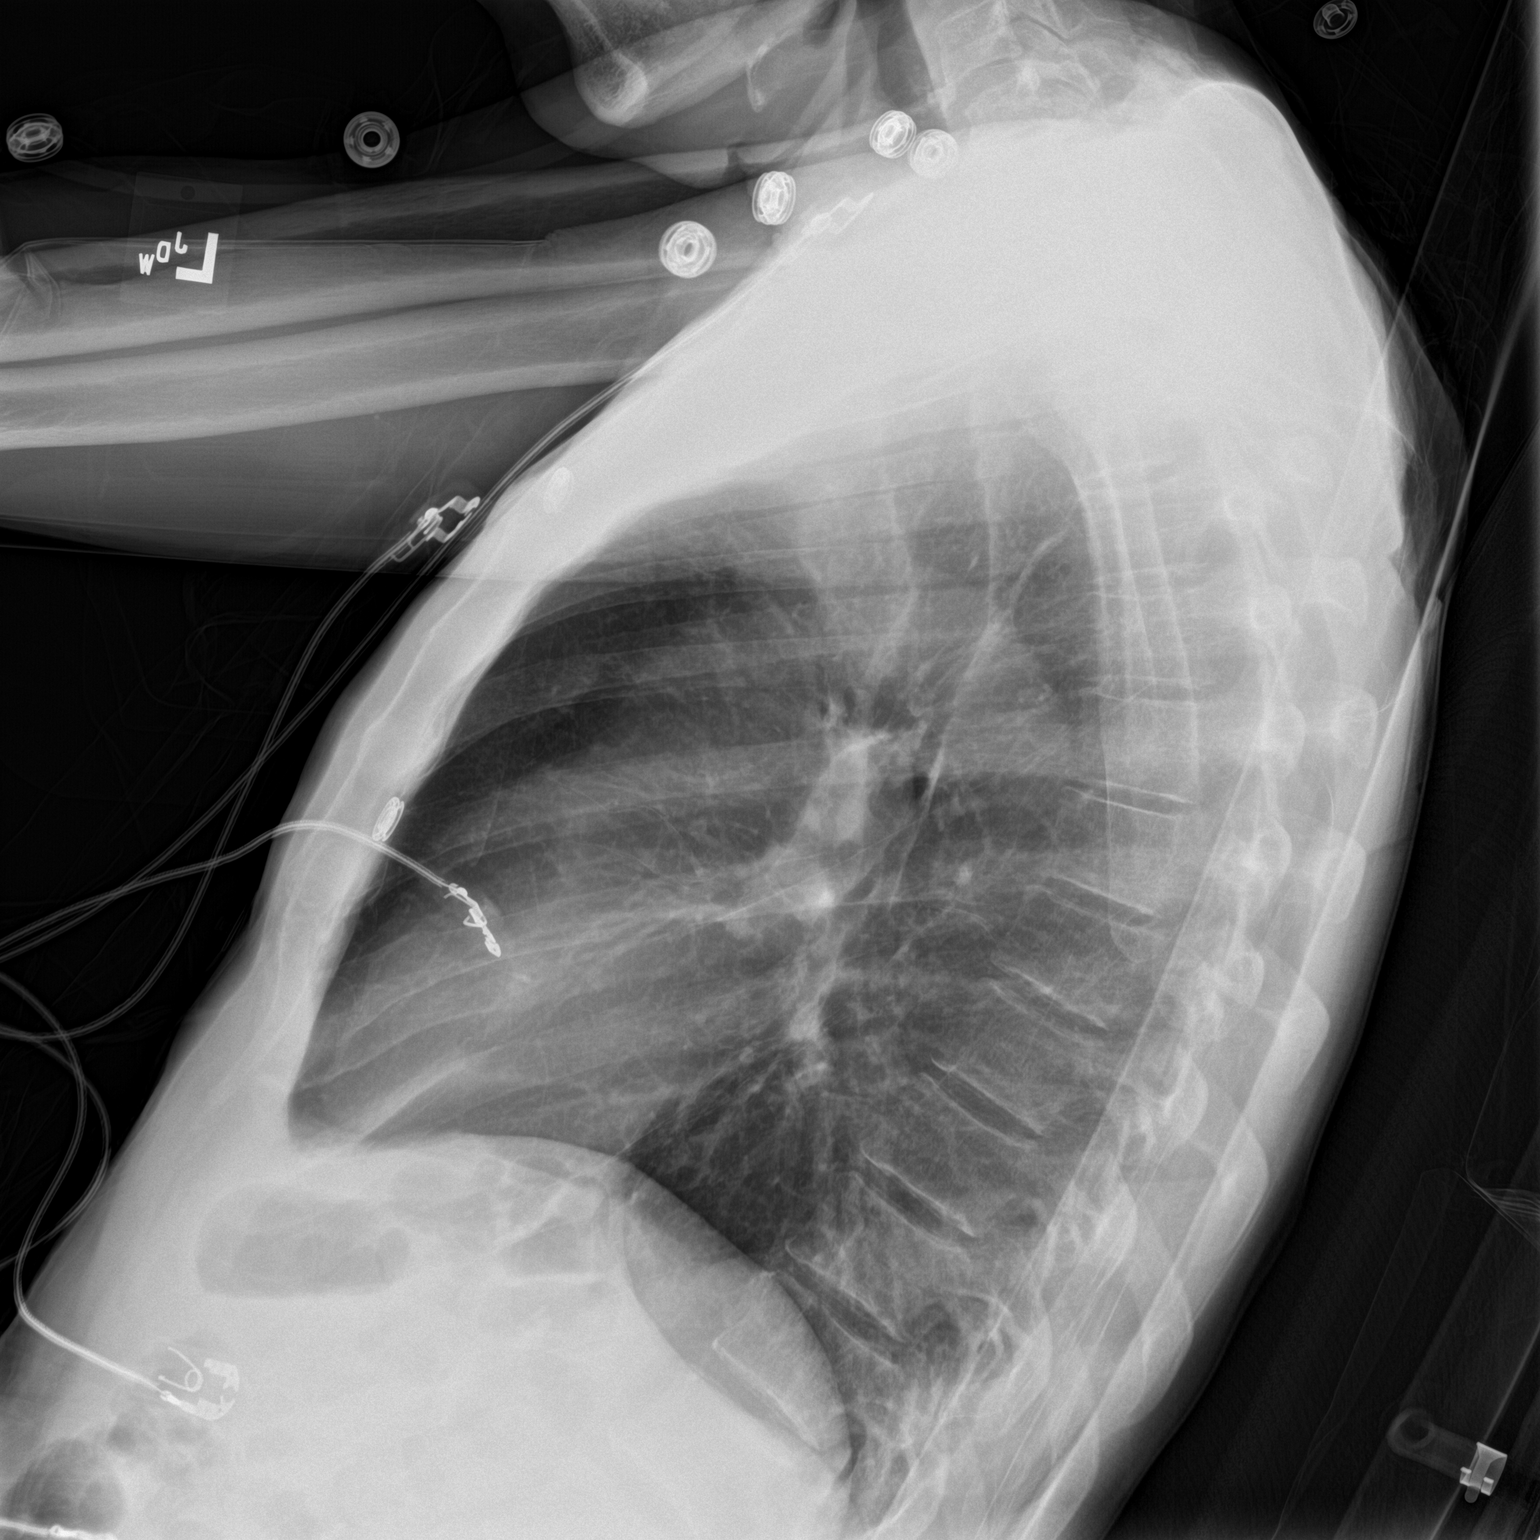

[chest ap]
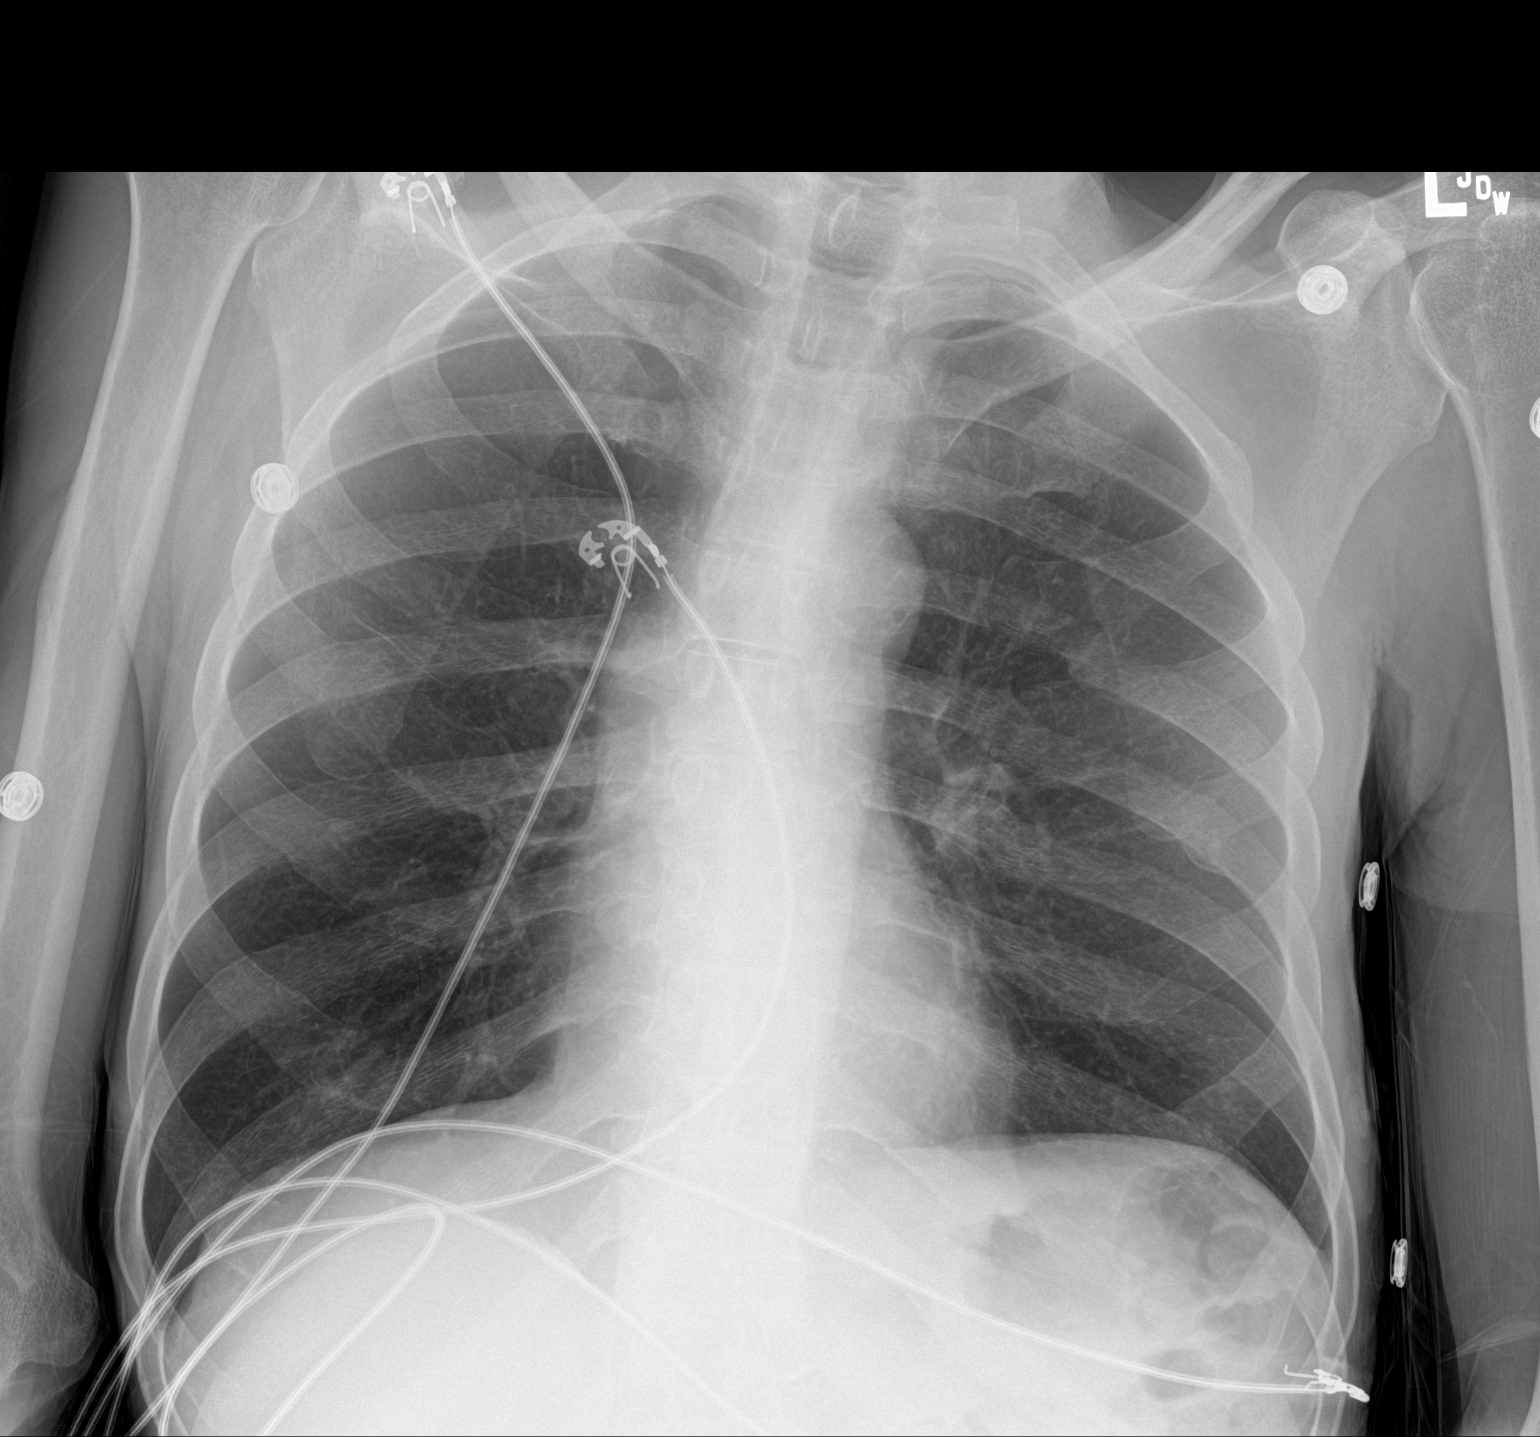

[2 of 2 positions shown; findings below may reference images not displayed]

FINDINGS: Normal heart size and mediastinal contours.

Minimal scar or atelectasis posterior to the trachea and overlapping
the lower heart in the lateral projection. No consolidation or
edema. No effusion or pneumothorax. No acute osseous findings.
IMPRESSION: No active cardiopulmonary disease.

## 2020-11-12 DEATH — deceased
